# Patient Record
Sex: Male | Born: 1945 | Hispanic: Yes | Smoking: Never smoker
Health system: Southern US, Community
[De-identification: ages and names within clinical notes are randomized; demographics above are authoritative.]

## PROBLEM LIST (undated history)

## (undated) DIAGNOSIS — K146 Glossodynia: Secondary | ICD-10-CM

## (undated) DIAGNOSIS — K219 Gastro-esophageal reflux disease without esophagitis: Secondary | ICD-10-CM

## (undated) DIAGNOSIS — I10 Essential (primary) hypertension: Secondary | ICD-10-CM

## (undated) DIAGNOSIS — K2270 Barrett's esophagus without dysplasia: Secondary | ICD-10-CM

## (undated) DIAGNOSIS — G20A1 Parkinson's disease without dyskinesia, without mention of fluctuations: Secondary | ICD-10-CM

## (undated) DIAGNOSIS — I2510 Atherosclerotic heart disease of native coronary artery without angina pectoris: Secondary | ICD-10-CM

## (undated) DIAGNOSIS — G20 Parkinson's disease: Secondary | ICD-10-CM

## (undated) HISTORY — PX: CARDIAC CATHETERIZATION: SHX172

---

## 2017-12-12 ENCOUNTER — Other Ambulatory Visit: Payer: Self-pay | Admitting: Otolaryngology

## 2017-12-12 DIAGNOSIS — R1312 Dysphagia, oropharyngeal phase: Secondary | ICD-10-CM

## 2017-12-12 DIAGNOSIS — K219 Gastro-esophageal reflux disease without esophagitis: Secondary | ICD-10-CM

## 2017-12-12 DIAGNOSIS — R490 Dysphonia: Secondary | ICD-10-CM

## 2017-12-14 ENCOUNTER — Ambulatory Visit
Admission: RE | Admit: 2017-12-14 | Discharge: 2017-12-14 | Disposition: A | Discharge status: Home / Self Care | Payer: Self-pay | Source: Ambulatory Visit | Attending: Otolaryngology | Admitting: Otolaryngology

## 2017-12-14 DIAGNOSIS — R1312 Dysphagia, oropharyngeal phase: Secondary | ICD-10-CM

## 2017-12-14 DIAGNOSIS — K219 Gastro-esophageal reflux disease without esophagitis: Secondary | ICD-10-CM

## 2017-12-14 DIAGNOSIS — R490 Dysphonia: Secondary | ICD-10-CM

## 2018-02-09 ENCOUNTER — Other Ambulatory Visit: Payer: Self-pay | Admitting: Otolaryngology

## 2018-02-09 DIAGNOSIS — R070 Pain in throat: Secondary | ICD-10-CM

## 2018-02-21 ENCOUNTER — Ambulatory Visit
Admission: RE | Admit: 2018-02-21 | Discharge: 2018-02-21 | Disposition: A | Discharge status: Home / Self Care | Payer: Non-veteran care | Source: Ambulatory Visit | Attending: Otolaryngology | Admitting: Otolaryngology

## 2018-02-21 DIAGNOSIS — R070 Pain in throat: Secondary | ICD-10-CM

## 2018-02-21 MED ORDER — IOPAMIDOL (ISOVUE-300) INJECTION 61%
75.0000 mL | Freq: Once | INTRAVENOUS | Status: AC | PRN
  Administered 2018-02-21: 75 mL via INTRAVENOUS

## 2019-04-24 ENCOUNTER — Emergency Department (HOSPITAL_COMMUNITY)
Admission: EM | Admit: 2019-04-24 | Discharge: 2019-04-24 | Disposition: A | Discharge status: Home / Self Care | Payer: No Typology Code available for payment source | Attending: Emergency Medicine | Admitting: Emergency Medicine

## 2019-04-24 ENCOUNTER — Emergency Department (HOSPITAL_COMMUNITY): Payer: No Typology Code available for payment source

## 2019-04-24 ENCOUNTER — Other Ambulatory Visit: Payer: Self-pay

## 2019-04-24 ENCOUNTER — Encounter (HOSPITAL_COMMUNITY): Payer: Self-pay

## 2019-04-24 DIAGNOSIS — M542 Cervicalgia: Secondary | ICD-10-CM | POA: Insufficient documentation

## 2019-04-24 DIAGNOSIS — Z79899 Other long term (current) drug therapy: Secondary | ICD-10-CM | POA: Diagnosis not present

## 2019-04-24 DIAGNOSIS — R519 Headache, unspecified: Secondary | ICD-10-CM | POA: Diagnosis present

## 2019-04-24 DIAGNOSIS — I10 Essential (primary) hypertension: Secondary | ICD-10-CM | POA: Insufficient documentation

## 2019-04-24 DIAGNOSIS — R070 Pain in throat: Secondary | ICD-10-CM | POA: Insufficient documentation

## 2019-04-24 LAB — CBC WITH DIFFERENTIAL/PLATELET
Abs Immature Granulocytes: 0.02 10*3/uL (ref 0.00–0.07)
Basophils Absolute: 0 10*3/uL (ref 0.0–0.1)
Basophils Relative: 1 %
Eosinophils Absolute: 0 10*3/uL (ref 0.0–0.5)
Eosinophils Relative: 0 %
HCT: 46.5 % (ref 39.0–52.0)
Hemoglobin: 14.4 g/dL (ref 13.0–17.0)
Immature Granulocytes: 0 %
Lymphocytes Relative: 27 %
Lymphs Abs: 2.2 10*3/uL (ref 0.7–4.0)
MCH: 28.8 pg (ref 26.0–34.0)
MCHC: 31 g/dL (ref 30.0–36.0)
MCV: 93 fL (ref 80.0–100.0)
Monocytes Absolute: 0.6 10*3/uL (ref 0.1–1.0)
Monocytes Relative: 7 %
Neutro Abs: 5.3 10*3/uL (ref 1.7–7.7)
Neutrophils Relative %: 65 %
Platelets: 225 10*3/uL (ref 150–400)
RBC: 5 MIL/uL (ref 4.22–5.81)
RDW: 13.3 % (ref 11.5–15.5)
WBC: 8.1 10*3/uL (ref 4.0–10.5)
nRBC: 0 % (ref 0.0–0.2)

## 2019-04-24 LAB — BASIC METABOLIC PANEL
Anion gap: 11 (ref 5–15)
BUN: 21 mg/dL (ref 8–23)
CO2: 24 mmol/L (ref 22–32)
Calcium: 9.2 mg/dL (ref 8.9–10.3)
Chloride: 104 mmol/L (ref 98–111)
Creatinine, Ser: 1.42 mg/dL — ABNORMAL HIGH (ref 0.61–1.24)
GFR calc Af Amer: 56 mL/min — ABNORMAL LOW (ref >60)
GFR calc non Af Amer: 48 mL/min — ABNORMAL LOW (ref >60)
Glucose, Bld: 96 mg/dL (ref 70–99)
Potassium: 4.1 mmol/L (ref 3.5–5.1)
Sodium: 139 mmol/L (ref 135–145)

## 2019-04-24 MED ORDER — AMLODIPINE BESYLATE 5 MG PO TABS
5.0000 mg | ORAL_TABLET | Freq: Once | ORAL | Status: AC
  Administered 2019-04-24: 5 mg via ORAL
  Filled 2019-04-24: qty 1

## 2019-04-24 MED ORDER — BUTALBITAL-APAP-CAFFEINE 50-325-40 MG PO TABS: 1.0000 | ORAL_TABLET | Freq: Four times a day (QID) | ORAL | 0 refills | Status: AC | PRN

## 2019-04-24 MED ORDER — AMLODIPINE BESYLATE 5 MG PO TABS: 5.0000 mg | ORAL_TABLET | Freq: Every day | ORAL | 0 refills | Status: DC

## 2019-04-24 NOTE — ED Provider Notes (Signed)
[facility]-EMERGENCY DEPT Provider Note   CSN: [id number] Arrival date & time: 04/24/19  0556     History   Chief Complaint Chief Complaint  Patient presents with   Hypertension   Headache    HPI Zachary Heath is a 73 y.o. male.     HPI   Monday night began to have headache Right sided headache, behind right eye and right neck Started in neck then radiated up to head, started and slowly worsened Tried ibuprofen which helped temporarily helped until nighttime then started again Last night checked BP was 194/90, is on amlodipine-benazepril, normally BP will run lower even when he doesn't take blood pressure medicine, has missed some doses, not necessarily more recently No falls or trauma, no fevers, no numbness/weakness (Right side weaker when checked for parkinson's at baseline, big toe has been numb no chronically)-nothing new with the headache Nausea, no vomiting. No exertional headaches.   History reviewed. No pertinent past medical history.  There are no active problems to display for this patient.   History reviewed. No pertinent surgical history.      Home Medications    Prior to Admission medications   Medication Sig Start Date End Date Taking? Authorizing Provider  amLODipine-benazepril (LOTREL) 10-40 MG capsule Take 1 capsule by mouth daily. 01/07/19  Yes [provider]  carbidopa-levodopa (SINEMET IR) 25-100 MG tablet Take 1 tablet by mouth 4 (four) times daily.  11/19/18  Yes [provider]  ibuprofen (ADVIL) 200 MG tablet Take 600 mg by mouth every 6 (six) hours as needed for fever, headache or moderate pain.   Yes [provider]  Multiple Vitamin (MULTIVITAMIN WITH MINERALS) TABS tablet Take 1 tablet by mouth daily.   Yes [provider]  OVER THE COUNTER MEDICATION Take 1 tablet by mouth daily. Clear nails plus vitamin   Yes [provider]  pantoprazole (PROTONIX) 40 MG tablet  Take 40 mg by mouth daily. 03/25/19  Yes [provider]  rasagiline (AZILECT) 1 MG TABS tablet Take 1 mg by mouth daily. 04/09/19  Yes [provider]  amLODipine (NORVASC) 5 MG tablet Take 1 tablet (5 mg total) by mouth daily. 04/24/19   Alvira Monday, MD  butalbital-acetaminophen-caffeine (FIORICET) 6024969804 MG tablet Take 1-2 tablets by mouth every 6 (six) hours as needed for headache. 04/24/19 04/23/20  Alvira Monday, MD    Family History History reviewed. No pertinent family history.  Social History Social History   Tobacco Use   Smoking status: Never Smoker   Smokeless tobacco: Never Used  Substance Use Topics   Alcohol use: Never    Frequency: Never   Drug use: Never     Allergies   Penicillin g   Review of Systems Review of Systems  Constitutional: Negative for fever.  HENT: Positive for sore throat (chronic not new).   Eyes: Negative for visual disturbance.  Respiratory: Negative for cough (occasional, not new) and shortness of breath.   Cardiovascular: Negative for chest pain.  Gastrointestinal: Positive for nausea. Negative for abdominal pain.  Genitourinary: Negative for dysuria.  Musculoskeletal: Positive for neck pain. Negative for back pain. Gait problem: with parkinson's.  Neurological: Positive for headaches. Negative for syncope, facial asymmetry, speech difficulty, weakness and numbness.     Physical Exam Updated Vital Signs BP 140/73    Pulse 82    Temp 98.7 F (37.1 C) (Oral)    Resp 16    SpO2 99%   Physical Exam Vitals signs  and nursing note reviewed.  Constitutional:      General: He is not in acute distress.    Appearance: He is well-developed. He is not diaphoretic.  HENT:     Head: Normocephalic and atraumatic.  Eyes:     General: No visual field deficit.    Conjunctiva/sclera: Conjunctivae normal.  Neck:     Musculoskeletal: Normal range of motion.  Cardiovascular:     Rate and Rhythm: Normal rate and  regular rhythm.     Heart sounds: Normal heart sounds. No murmur. No friction rub. No gallop.   Pulmonary:     Effort: Pulmonary effort is normal. No respiratory distress.     Breath sounds: Normal breath sounds. No wheezing or rales.  Abdominal:     General: There is no distension.     Palpations: Abdomen is soft.     Tenderness: There is no abdominal tenderness. There is no guarding.  Skin:    General: Skin is warm and dry.  Neurological:     Mental Status: He is alert and oriented to person, place, and time.     GCS: GCS eye subscore is 4. GCS verbal subscore is 5. GCS motor subscore is 6.     Cranial Nerves: Cranial nerves are intact. No cranial nerve deficit, dysarthria or facial asymmetry.     Sensory: Sensation is intact. No sensory deficit.     Motor: Motor function is intact. No weakness or pronator drift.     Coordination: Coordination is intact. Coordination normal. Heel to Shin Test normal.     Gait: Gait is intact.      ED Treatments / Results  Labs (all labs ordered are listed, but only abnormal results are displayed) Labs Reviewed  BASIC METABOLIC PANEL - Abnormal; Notable for the following components:      Result Value   Creatinine, Ser 1.42 (*)    GFR calc non Af Amer 48 (*)    GFR calc Af Amer 56 (*)    All other components within normal limits  CBC WITH DIFFERENTIAL/PLATELET    EKG None  Radiology Ct Head Wo Contrast  Result Date: 04/24/2019 CLINICAL DATA:  73 year old male with acute headache for 3 days. Initial encounter. EXAM: CT HEAD WITHOUT CONTRAST TECHNIQUE: Contiguous axial images were obtained from the base of the skull through the vertex without intravenous contrast. COMPARISON:  None. FINDINGS: Brain: No evidence of acute infarction, hemorrhage, hydrocephalus, extra-axial collection or mass lesion/mass effect. Mild atrophy and chronic small-vessel white matter ischemic changes noted. Vascular: Carotid atherosclerotic calcifications noted. Skull:  Normal. Negative for fracture or focal lesion. Sinuses/Orbits: No acute finding. Other: None. IMPRESSION: 1. No evidence of acute intracranial abnormality. 2. Mild atrophy and chronic small-vessel white matter ischemic changes. Electronically Signed   By: Harmon PierJeffrey  Hu M.D.   On: 04/24/2019 10:17    Procedures Procedures (including critical care time)  Medications Ordered in ED Medications  amLODipine (NORVASC) tablet 5 mg (5 mg Oral Given 04/24/19 0809)     Initial Impression / Assessment and Plan / ED Course  I have reviewed the triage vital signs and the nursing notes.  Pertinent labs & imaging results that were available during my care of the patient were reviewed by me and considered in my medical decision making (see chart for details).        73yo male with history of hypertension, CKD presents with concern for headache.  CT head WNL. No neurologic symptoms, or signs of CVA by history or  exam. NO fever, no signs of meningitis.  Headache began slowly, doubt occult SAH.  BP elevated. Denies other symptoms to suggest htn emergency, no CP/dyspnea.  Renal function at baseline on check with [facility] baseline 1.4.   Does report some noncompliance with medications. Recommend taking BP medication as prescribed, gave rx for fioricet for headache prn. Rec PCP follow up for htn and CKD. Patient discharged in stable condition with understanding of reasons to return.   Final Clinical Impressions(s) / ED Diagnoses   Final diagnoses:  Acute nonintractable headache, unspecified headache type    ED Discharge Orders         Ordered    butalbital-acetaminophen-caffeine (FIORICET) 50-325-40 MG tablet  Every 6 hours PRN     04/24/19 1043    amLODipine (NORVASC) 5 MG tablet  Daily     04/24/19 1043           Gareth Morgan, MD 04/24/19 2108

## 2019-04-24 NOTE — ED Notes (Signed)
Patient transported to CT 

## 2019-04-24 NOTE — ED Triage Notes (Signed)
Pt complains of an intermittent headache since Monday, he states the headache went away with ibuprofen but this am the headache woke him up and he checked his blood pressure and it was high Pt does take BP medication

## 2019-04-24 NOTE — ED Notes (Signed)
ED Provider at bedside. 

## 2020-06-08 ENCOUNTER — Other Ambulatory Visit (HOSPITAL_BASED_OUTPATIENT_CLINIC_OR_DEPARTMENT_OTHER): Payer: Self-pay

## 2020-06-08 DIAGNOSIS — R0683 Snoring: Secondary | ICD-10-CM

## 2020-07-03 ENCOUNTER — Encounter (HOSPITAL_BASED_OUTPATIENT_CLINIC_OR_DEPARTMENT_OTHER): Payer: Non-veteran care | Admitting: Internal Medicine

## 2021-01-26 ENCOUNTER — Encounter (HOSPITAL_COMMUNITY): Payer: Self-pay

## 2021-01-26 ENCOUNTER — Telehealth (HOSPITAL_COMMUNITY): Payer: Self-pay

## 2021-01-26 NOTE — Telephone Encounter (Signed)
Attempted to call patient in regards to Cardiac Rehab - LM on VM Mailed letter 

## 2021-01-26 NOTE — Telephone Encounter (Signed)
Referral was recv'ed from Dr. Nevada Crane.

## 2021-01-26 NOTE — Telephone Encounter (Signed)
Outside/paper referral received by Dr. Melchor Amour from [facility].

## 2021-01-27 ENCOUNTER — Telehealth (HOSPITAL_COMMUNITY): Payer: Self-pay

## 2021-01-27 NOTE — Telephone Encounter (Signed)
Received an office referral for pt to participate in the cardiac rehab program, called pt to see if he was interested. Pt stated that he is not interested at this time and that he would give [place] a call if anything changes.

## 2021-02-17 NOTE — Progress Notes (Signed)
 [facility] Note 02/17/2021  Patient:  Zachary Heath  [id number]  MRN: [id number] DOB: [birth year] Age: 75 y.o.  Encounter Date:  02/17/2021  Reason for Visit: Post Operative Follow Up   Subjective:  HPI:  Zachary Heath presents today for an initial post op follow up after undergoing a 3-vessel CABG (fRIMA-D1-OM1, LIMA-LAD) on 01/14/21 via full median sternotomy. He was discharged to [facility] on 01/21/21 after an uncomplicated hospital course. He was discharged on from [facility] on 02/01/21  Today, he voiced good progress from [facility]. He voices continued chest wall pain but relieved by acetaminophen . He also voiced difficulty with ambulation but attributes it to his Parkinson's disease. Denies incisional redness, drainage, or opening. Appetite and eating have improved. Sleep has improved. Ambulating well without assitance. Denies recent fever, chills, HA,CP, palpitations, SOB, abd discomfort, leg swelling, weakness.   Past Medical History:  Diagnosis Date   Coronary artery disease    GERD (gastroesophageal reflux disease)    Hypertension    Hypertension with goal to be determined    Parkinson's disease (HCC)     Past Surgical History:  Procedure Laterality Date   CORONARY ARTERY BYPASS GRAFT N/A 01/14/2021   Procedure: CABG ON PUMP - CORONARY ARTERY BYPASS GRAFT;  Surgeon: [facility] JONETTA Dines, MD;  Location: [facility] MAIN OR;  Service: Cardiothoracic;  Laterality: N/A;   NISSEN FUNDOPLICATION     NISSEN FUNDOPLICATION N/A     Family History  Problem Relation Age of Onset   Heart disease Brother        Unknown type   Stroke Brother     Social History   Socioeconomic History   Marital status: Separated Not Legal    Spouse name: Not on file   Number of children: Not on file   Years of education: Not on file   Highest education level: Not on file  Occupational History   Not on file  Tobacco Use    Smoking status: Never Smoker   Smokeless tobacco: Never Used  Vaping Use   Vaping Use: Never used  Substance and Sexual Activity   Alcohol use: No   Drug use: No   Sexual activity: Not on file  Other Topics Concern   Not on file  Social History Narrative   Denies alcohol or tobacco use. Denies illicit drugs. States his diet is mostly rice and chicken and could use more vegetables.   Social Determinants of Health   Financial Resource Strain: Not on file  Food Insecurity: Not on file  Transportation Needs: Not on file  Physical Activity: Not on file  Stress: Not on file  Social Connections: Not on file  Housing Stability: Not on file    Allergies  Allergen Reactions   Penicillin Anaphylaxis (ALLERGY)   Benazepril Cough (ALLERGY/intolerance)    Pt states he is not aware of this allergy    Outpatient Medications Prior to Visit  Medication Sig Dispense Refill   acetaminophen  (TYLENOL ) 500 MG tablet Take 2 tablets (1,000 mg total) by mouth every 8 (eight) hours as needed.     aspirin  81 MG EC tablet *ANTIPLATELET* Take 2 tablets (162 mg total) by mouth daily. 60 tablet 0   carbidopa -levodopa  (SINEMET  CR) 50-200 mg per tablet Take 1 tablet by mouth nightly.     carbidopa -levodopa  (SINEMET ) 25-100 mg per tablet Take 1 tablet by mouth 4 times daily.     cetirizine (ZYRTEC) 10 MG tablet Take 1  tablet (10 mg total) by mouth daily. 30 tablet 0   cholecalciferol, vitamin D3, 50 mcg (2,000 unit) Cap Take 1 capsule by mouth daily.     ferrous sulfate (FERATAB) 325 (65 FE) MG tablet Take 325 mg by mouth daily.     furosemide  (LASIX ) 40 MG tablet Take 1 tablet (40 mg total) by mouth daily. 30 tablet 0   guaiFENesin  (ROBITUSSIN) 100 mg/5 mL syrup Take 10 mLs (200 mg total) by mouth every 4 (four) hours as needed for Cough. 120 mL 0   metoprolol  tartrate (LOPRESSOR ) 37.5 mg tablet Take 1 tablet (37.5 mg total) by mouth 2 times daily. 60 tablet 0   multivit  w/iron-minerals (THERA-M, THERA M PLUS) 9 mg iron-400 mcg Tab tablet Take 1 tablet by mouth daily. 30 tablet 0   pantoprazole  (PROTONIX ) 40 MG tablet Take 40 mg by mouth in the morning and at bedtime.     polyethylene glycol (MIRALAX ) 17 gram packet Take 17 g by mouth daily as needed. 15 packet 0   rasagiline  (AZILECT ) 1 mg tablet Take 0.5 mg by mouth with lunch.     rOPINIRole  (REQUIP ) 1 MG tablet Take 1 mg by mouth 4 times daily.     rosuvastatin (CRESTOR) 20 MG tablet Take 1 tablet (20 mg total) by mouth daily. 30 tablet 0   senna-docusate (PERICOLACE, SENOKOT-S) 8.6-50 mg per tablet Take 2 tablets by mouth 2 times daily.  0   triamcinolone (NASACORT) 55 mcg nasal inhaler 2 sprays by Nasal route daily.     zinc gluconate 50 mg tablet Take 50 mg by mouth daily.     No facility-administered medications prior to visit.      Objective:  Physical Examination:  Blood pressure 150/66, pulse 78, temperature 97.2 F (36.2 C), temperature source Temporal, resp. rate 16, height 1.702 m (5' 7), weight 73.5 kg (162 lb), SpO2 100 %. General appearance - alert, well appearing, and in no distress Mental status - normal mood, behavior, speech, dress, motor activity, and thought processes Eyes - sclera anicteric Ears - hearing grossly normal bilaterally Chest - clear to auscultation, no wheezes, rales or rhonchi, symmetric air entry Heart - normal rate, regular rhythm, normal S1, S2, no murmurs, rubs, clicks or gallops Abdomen - soft, nontender, nondistended, no masses or organomegaly Extremities - no pedal edema noted Skin - full median sternotomy, blake drains sites well-approximated without erythema or drainage.   Assessment: CAD S/P 3-vessel CABG (fRIMA-D1-OM1, LIMA-LAD) on 01/14/21 - progress from [facility] is as expected   Plan: 1. Continue all current medications. 2. Eat low fat / low salt diet. Increase walking / exercise. 3. CT [facility] follow up as needed.  4. Continue to see PCP  / cardiologist for future cardiac surveillance.   Pt voiced understanding and agreed with plan.   Future Appointments      Provider Department Dept Phone Center   04/08/2021 10:15 AM Richardson Ee [facility] - [place] [phone] [facility]        Electronically signed by: Grover Lunger Dimashuhid Vedar, PA-C 02/17/2021     Electronically signed by: Grover Lunger Dimasuhid Vedar, PA-C 02/17/21 1142

## 2021-02-24 ENCOUNTER — Emergency Department (HOSPITAL_COMMUNITY): Payer: No Typology Code available for payment source

## 2021-02-24 ENCOUNTER — Encounter (HOSPITAL_COMMUNITY): Payer: Self-pay | Admitting: Emergency Medicine

## 2021-02-24 ENCOUNTER — Other Ambulatory Visit: Payer: Self-pay

## 2021-02-24 ENCOUNTER — Emergency Department (HOSPITAL_COMMUNITY)
Admission: EM | Admit: 2021-02-24 | Discharge: 2021-02-24 | Disposition: A | Discharge status: Home / Self Care | Payer: No Typology Code available for payment source | Attending: Emergency Medicine | Admitting: Emergency Medicine

## 2021-02-24 DIAGNOSIS — R0602 Shortness of breath: Secondary | ICD-10-CM | POA: Diagnosis not present

## 2021-02-24 DIAGNOSIS — Z79899 Other long term (current) drug therapy: Secondary | ICD-10-CM | POA: Insufficient documentation

## 2021-02-24 DIAGNOSIS — Z951 Presence of aortocoronary bypass graft: Secondary | ICD-10-CM | POA: Insufficient documentation

## 2021-02-24 DIAGNOSIS — I2510 Atherosclerotic heart disease of native coronary artery without angina pectoris: Secondary | ICD-10-CM | POA: Diagnosis not present

## 2021-02-24 DIAGNOSIS — I10 Essential (primary) hypertension: Secondary | ICD-10-CM | POA: Diagnosis not present

## 2021-02-24 DIAGNOSIS — R030 Elevated blood-pressure reading, without diagnosis of hypertension: Secondary | ICD-10-CM | POA: Diagnosis present

## 2021-02-24 HISTORY — DX: Essential (primary) hypertension: I10

## 2021-02-24 LAB — CBC
HCT: 39.4 % (ref 39.0–52.0)
Hemoglobin: 12.2 g/dL — ABNORMAL LOW (ref 13.0–17.0)
MCH: 29.4 pg (ref 26.0–34.0)
MCHC: 31 g/dL (ref 30.0–36.0)
MCV: 94.9 fL (ref 80.0–100.0)
Platelets: 226 10*3/uL (ref 150–400)
RBC: 4.15 MIL/uL — ABNORMAL LOW (ref 4.22–5.81)
RDW: 14.4 % (ref 11.5–15.5)
WBC: 8.1 10*3/uL (ref 4.0–10.5)
nRBC: 0 % (ref 0.0–0.2)

## 2021-02-24 LAB — BRAIN NATRIURETIC PEPTIDE: B Natriuretic Peptide: 241.9 pg/mL — ABNORMAL HIGH (ref 0.0–100.0)

## 2021-02-24 LAB — BASIC METABOLIC PANEL
Anion gap: 8 (ref 5–15)
BUN: 27 mg/dL — ABNORMAL HIGH (ref 8–23)
CO2: 28 mmol/L (ref 22–32)
Calcium: 9.3 mg/dL (ref 8.9–10.3)
Chloride: 106 mmol/L (ref 98–111)
Creatinine, Ser: 1.59 mg/dL — ABNORMAL HIGH (ref 0.61–1.24)
GFR, Estimated: 45 mL/min — ABNORMAL LOW (ref >60)
Glucose, Bld: 96 mg/dL (ref 70–99)
Potassium: 4.5 mmol/L (ref 3.5–5.1)
Sodium: 142 mmol/L (ref 135–145)

## 2021-02-24 LAB — TROPONIN I (HIGH SENSITIVITY): Troponin I (High Sensitivity): 19 ng/L — ABNORMAL HIGH (ref <18)

## 2021-02-24 MED ORDER — LOSARTAN POTASSIUM 25 MG PO TABS: ORAL_TABLET | ORAL | 0 refills | Status: DC

## 2021-02-24 NOTE — ED Notes (Signed)
Pt was able to ambulate without any assistance. Pt denied any weakness, shortness of breath with ambulation. Pt overall stated he "felt normal" with ambulation. Pt's SpO2 maintained 100% RA with ambulation with good waveform.

## 2021-02-24 NOTE — Discharge Instructions (Addendum)
Start out with losartan 25mg  daily If Blood pressure does not improve, you can start 50mg  after 5 days Please see your cardiologist in one week

## 2021-02-24 NOTE — ED Triage Notes (Signed)
Pt states he has shortness of breath that began 10 minutes ago. Pt had triple bypass surgery on 01/14/21. Pt denies pain.

## 2021-02-24 NOTE — ED Provider Notes (Signed)
[facility]-EMERGENCY DEPT Provider Note   CSN: [id number] Arrival date & time: 02/24/21  0216     History Chief Complaint  Patient presents with   Shortness of Breath    Zachary Heath is a 75 y.o. male.  The history is provided by the patient and a relative.  Shortness of Breath Severity:  Moderate Onset quality:  Sudden Timing:  Constant Progression:  Improving Chronicity:  New Relieved by:  None tried Associated symptoms: no abdominal pain, no chest pain and no fever      Patient with history of hypertension and CAD presents with shortness of breath.  Patient reports he was watching baseball tonight when he had sudden onset of shortness of breath.  No chest pain.  No fevers, no new cough. He is now feeling improved.  He reports he had CABG performed on July 1.  He has done well since the surgery, and denies any dyspnea on exertion.  Reports no new significant lower extremity edema.  He reports medication compliance but does admit that they stopped his losartan recently due to low blood pressures He is a non-smoker. Past Medical History:  Diagnosis Date   Hypertension      Social History   Tobacco Use   Smoking status: Never   Smokeless tobacco: Never  Substance Use Topics   Alcohol use: Never   Drug use: Never    Home Medications Prior to Admission medications   Medication Sig Start Date End Date Taking? Authorizing Provider  amLODipine-benazepril (LOTREL) 10-40 MG capsule Take 1 capsule by mouth daily. 01/07/19   [provider]  carbidopa-levodopa (SINEMET IR) 25-100 MG tablet Take 1 tablet by mouth 4 (four) times daily.  11/19/18   [provider]  ibuprofen (ADVIL) 200 MG tablet Take 600 mg by mouth every 6 (six) hours as needed for fever, headache or moderate pain.    [provider]  Multiple Vitamin (MULTIVITAMIN WITH MINERALS) TABS tablet Take 1 tablet by mouth daily.    [provider]  OVER  THE COUNTER MEDICATION Take 1 tablet by mouth daily. Clear nails plus vitamin    [provider]  pantoprazole (PROTONIX) 40 MG tablet Take 40 mg by mouth daily. 03/25/19   [provider]  rasagiline (AZILECT) 1 MG TABS tablet Take 1 mg by mouth daily. 04/09/19   [provider]  amLODipine (NORVASC) 5 MG tablet Take 1 tablet (5 mg total) by mouth daily. 04/24/19 04/24/19  Alvira Monday, MD    Allergies    Penicillin g  Review of Systems   Review of Systems  Constitutional:  Negative for fever.  Respiratory:  Positive for shortness of breath.   Cardiovascular:  Negative for chest pain.  Gastrointestinal:  Negative for abdominal pain.  Musculoskeletal:  Negative for back pain.  All other systems reviewed and are negative.  Physical Exam Updated Vital Signs BP (!) 163/82   Pulse 74   Temp 97.9 F (36.6 C) (Oral)   Resp 20   Ht 1.702 m (5\' 7" )   Wt 75.8 kg   SpO2 99%   BMI 26.16 kg/m   Physical Exam CONSTITUTIONAL: Elderly, no acute distress HEAD: Normocephalic/atraumatic EYES: EOMI/PERRL ENMT: Mucous membranes moist NECK: supple no meningeal signs SPINE/BACK:entire spine nontender CV: S1/S2 noted, no murmurs/rubs/gallops noted LUNGS: Scattered wheeze, no acute distress.  No crackles ABDOMEN: soft, nontender, no rebound or guarding, bowel sounds noted throughout abdomen GU:no cva tenderness NEURO: Pt is awake/alert/appropriate, moves all extremitiesx4.  No facial droop.   EXTREMITIES: pulses normal/equal, full ROM, minimal pitting edema to bilateral lower extremities SKIN: warm, color normal PSYCH: no abnormalities of mood noted, alert and oriented to situation  ED Results / Procedures / Treatments   Labs (all labs ordered are listed, but only abnormal results are displayed) Labs Reviewed  CBC - Abnormal; Notable for the following components:      Result Value   RBC 4.15 (*)    Hemoglobin 12.2 (*)    All other components within normal  limits  TROPONIN I (HIGH SENSITIVITY) - Abnormal; Notable for the following components:   Troponin I (High Sensitivity) 19 (*)    All other components within normal limits  BASIC METABOLIC PANEL  BRAIN NATRIURETIC PEPTIDE    EKG EKG Interpretation  Date/Time:  Thursday February 24 2021 02:25:50 EDT Ventricular Rate:  82 PR Interval:  172 QRS Duration: 103 QT Interval:  370 QTC Calculation: 433 R Axis:   -3 Text Interpretation: Sinus rhythm Atrial premature complex Probable left atrial enlargement LVH with secondary repolarization abnormality 12 Lead; Mason-Likar Interpretation limited secondary to artifact Confirmed by Zadie Rhine ([id number]) on 02/24/2021 2:38:09 AM  Radiology DG Chest 2 View  Result Date: 02/24/2021 CLINICAL DATA:  Sudden onset shortness of breath, history of heart surgery EXAM: CHEST - 2 VIEW COMPARISON:  Esophagram 12/14/2017 FINDINGS: Blunting of right costophrenic sulcus may reflect a small effusion. Mild pulmonary vascular congestion without features of frank edema. Postsurgical changes from prior sternotomy and CABG. The aorta is calcified. The remaining cardiomediastinal contours are unremarkable. Telemetry leads overlie the chest. No acute osseous or soft tissue abnormality. IMPRESSION: Blunting of the right costophrenic sulcus could reflect small effusion or scarring. Mild pulmonary vascular congestion without evidence of frank edema. Prior sternotomy, CABG. Aortic Atherosclerosis (ICD10-I70.0). Electronically Signed   By: Kreg Shropshire M.D.   On: 02/24/2021 03:35    Procedures Procedures   Medications Ordered in ED Medications - No data to display  ED Course  I have reviewed the triage vital signs and the nursing notes.  Pertinent labs & imaging results that were available during my care of the patient were reviewed by me and considered in my medical decision making (see chart for details).    MDM Rules/Calculators/A&P                           4:36  AM Patient presents with shortness of breath that has since resolved.  Patient ambulated reports department with any hypoxia.  He is in no acute distress and appears well.  He flatly denies any chest pain or back pain Patient underwent coronary bypass surgery on July 1 at [facility] It appears that he has mildly chronically elevated troponins. Will check BNP and reassess. 5:51 AM Patient is in no acute distress.  He ambulated without difficulty Patient feels at baseline.  He appears to be doing very well post CABG. I suspect his elevations of blood pressure may contribute to his symptoms.  He would like to restart losartan.  He will be placed on 25 mg daily, and if no improvement he can go to his normal dose of 50 mg daily He needs to see his cardiologist in the next week. Low suspicion for acute CHF/PE/pneumonia at this time. Patient is requesting discharge Final Clinical Impression(s) / ED Diagnoses Final diagnoses:  Primary hypertension  Shortness of breath    Rx / DC Orders ED Discharge Orders  Ordered    losartan (COZAAR) 25 MG tablet        02/24/21 0534             Zadie Rhine, MD 02/24/21 [phone]

## 2021-02-24 NOTE — ED Notes (Signed)
Per [facility]@ main lab recollect needed for light green tube. [employer]

## 2021-05-24 IMAGING — CT CT HEAD W/O CM
3 series · 16 of 47 positions shown, 19 images · non-contrast
Comparison: None.

CLINICAL DATA: 73-year-old male with acute headache for 3 days.
Initial encounter.

EXAM:
CT HEAD WITHOUT CONTRAST
TECHNIQUE: Contiguous axial images were obtained from the base of the skull
through the vertex without intravenous contrast.

[Series 2: head wo · axial · 0.47mm/px · z∈[-93,+37]mm · 10 of 32 slices shown, 13 images]
[im 3/32  brain]
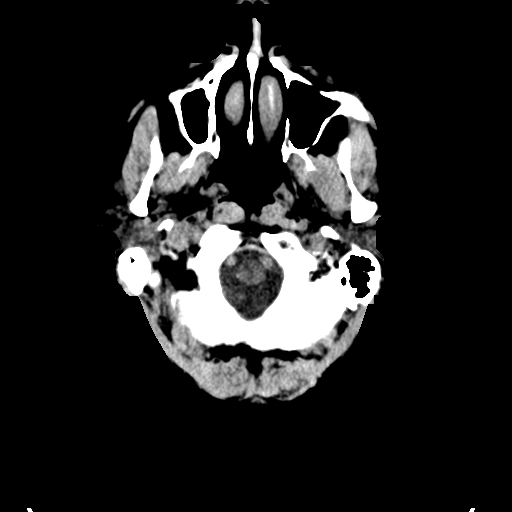
[im 3/32  bone]
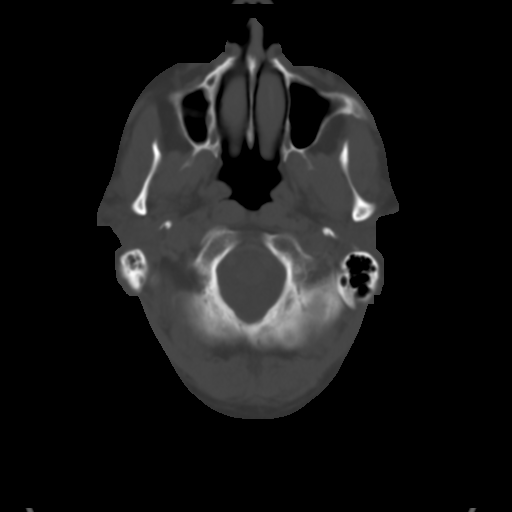
[im 6/32  brain]
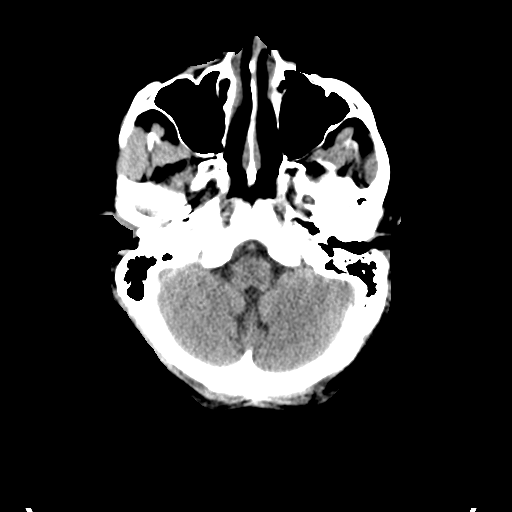
[im 9/32  brain]
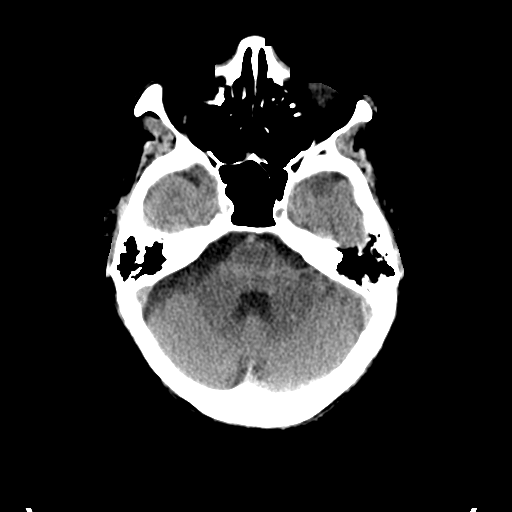
[im 11/32  brain]
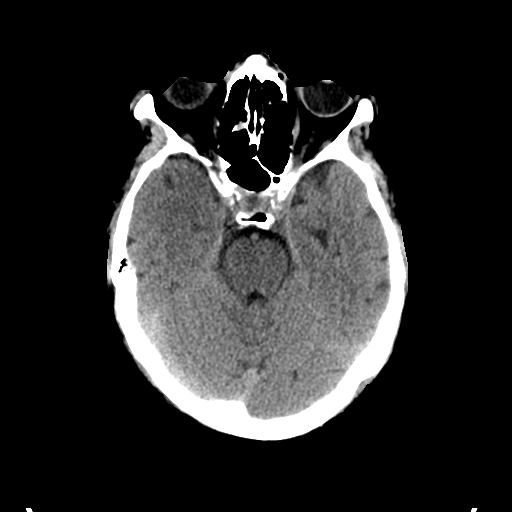
[im 14/32  brain]
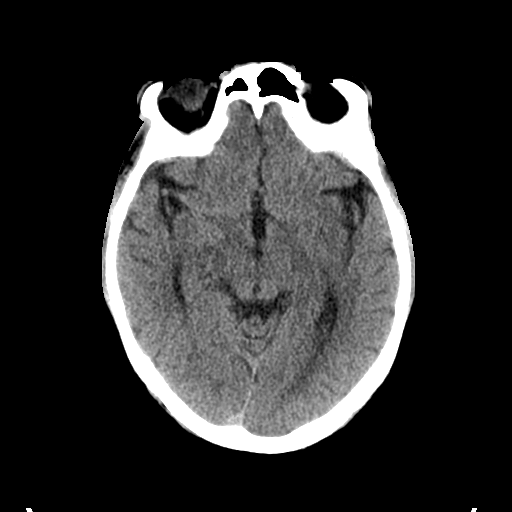
[im 14/32  bone]
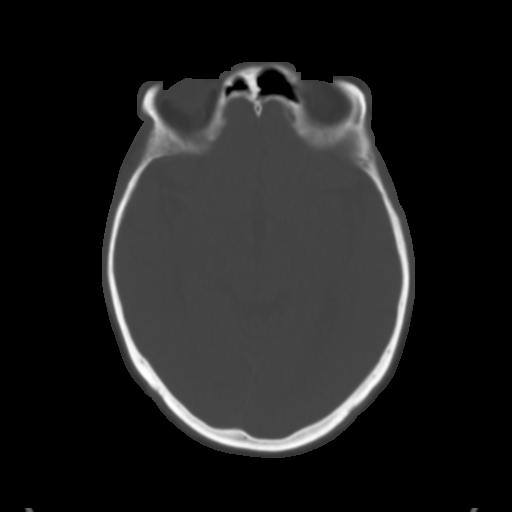
[im 18/32  brain]
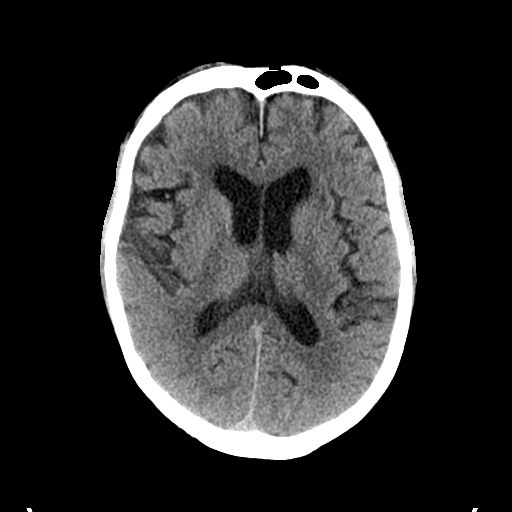
[im 21/32  brain]
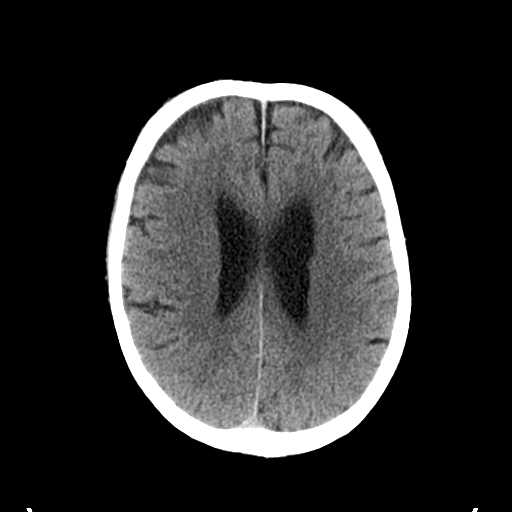
[im 24/32  brain]
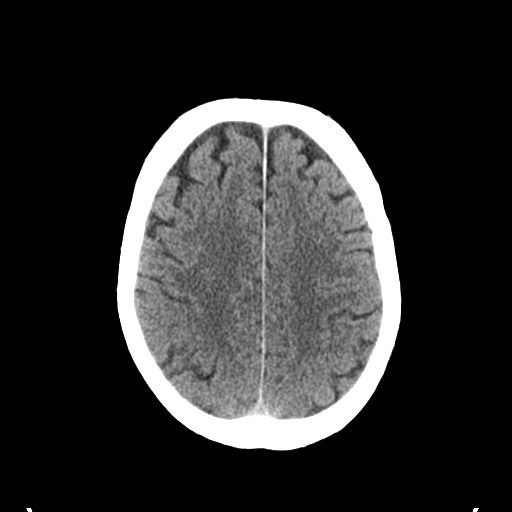
[im 26/32  brain]
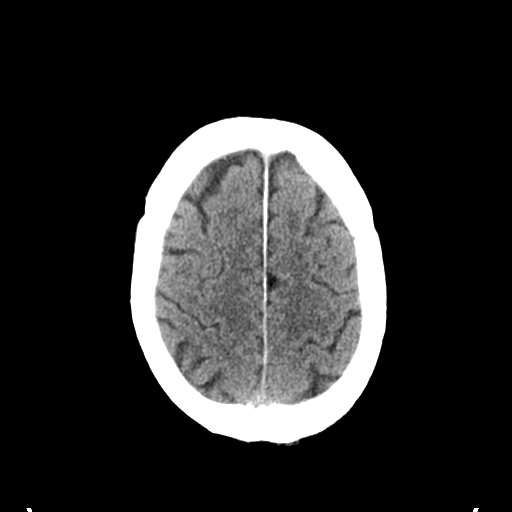
[im 26/32  bone]
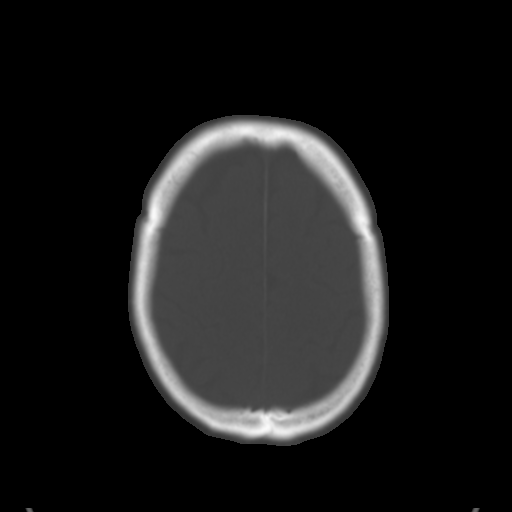
[im 29/32  brain]
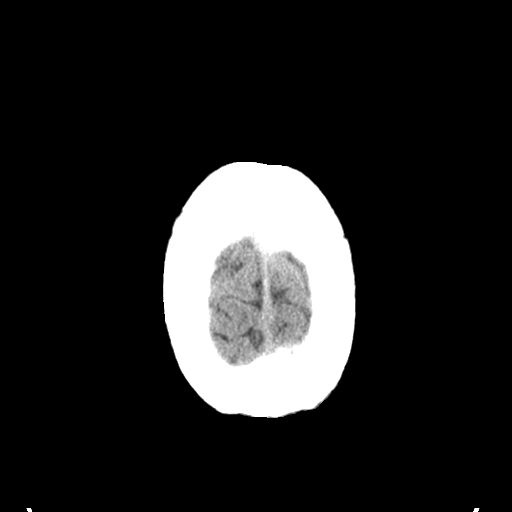

[Series 5: coronal soft tissue · coronal · 0.33mm/px · 3 of 73 slices shown]
[im 25/73  brain]
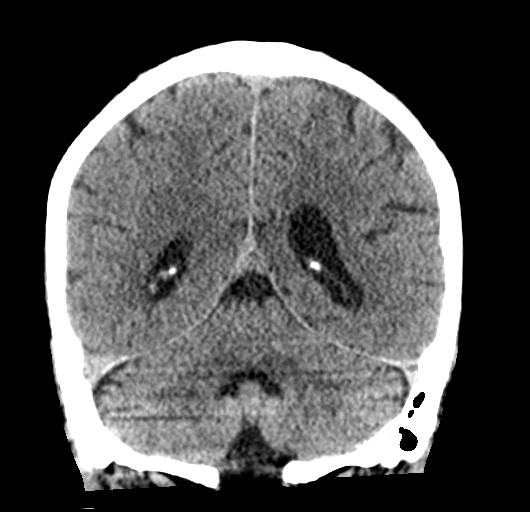
[im 33/73  brain]
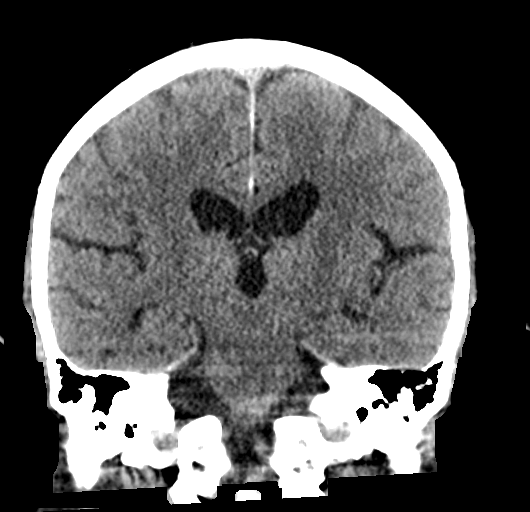
[im 41/73  brain]
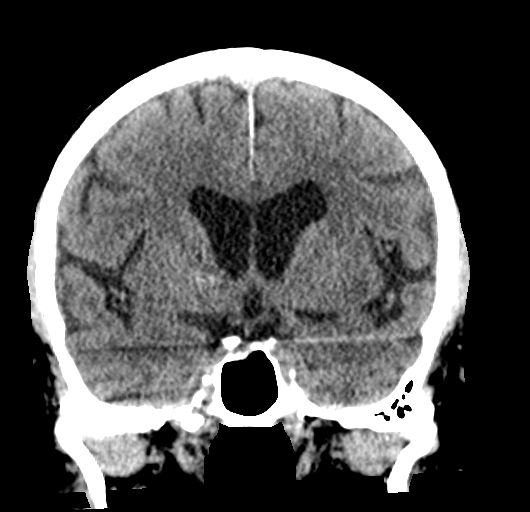

[Series 6: sagittal soft tissue · sagittal · 0.33mm/px · 3 of 59 slices shown]
[im 20/59  brain]
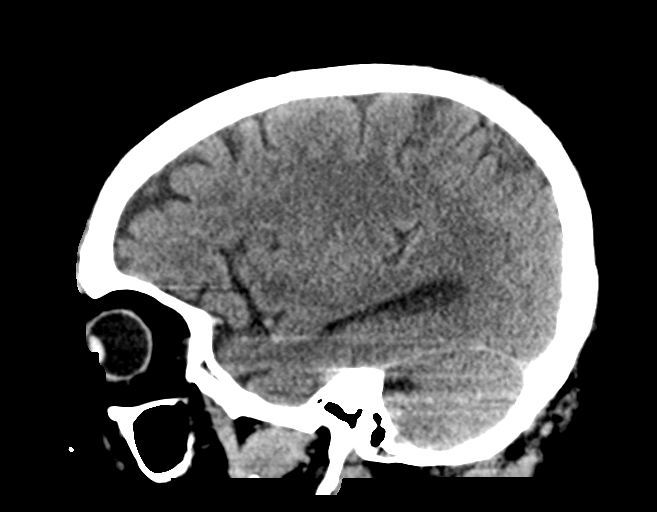
[im 30/59  brain]
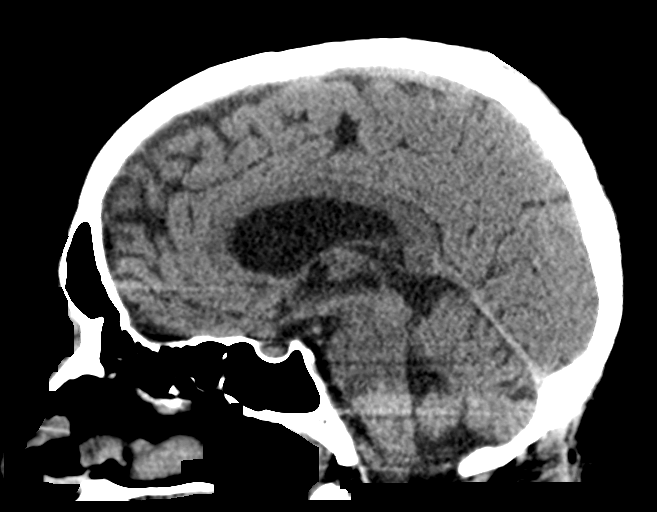
[im 39/59  brain]
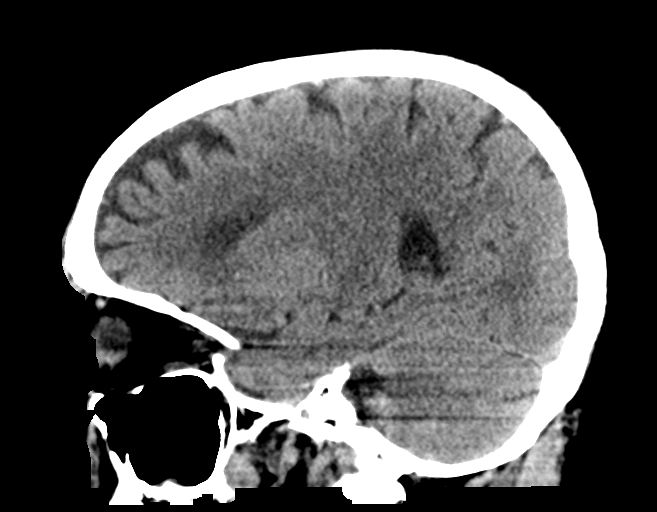

[16 of 47 positions shown; findings below may reference images not displayed]

FINDINGS: Brain: No evidence of acute infarction, hemorrhage, hydrocephalus,
extra-axial collection or mass lesion/mass effect.

Mild atrophy and chronic small-vessel white matter ischemic changes
noted.

Vascular: Carotid atherosclerotic calcifications noted.

Skull: Normal. Negative for fracture or focal lesion.

Sinuses/Orbits: No acute finding.

Other: None.
IMPRESSION: 1. No evidence of acute intracranial abnormality.
2. Mild atrophy and chronic small-vessel white matter ischemic
changes.

## 2021-07-20 ENCOUNTER — Telehealth (HOSPITAL_COMMUNITY): Payer: Self-pay | Admitting: *Deleted

## 2021-07-20 NOTE — Telephone Encounter (Signed)
Received call from pt daughter Anderson Malta.  Asked if we had received [facility] authorization for her her father to participate in CR.  Advised pt daughter that yes, I had received the authorization and are awaiting additional requested documents. Once received and reviewed will contact for scheduling. Verbalized understanding. Cherre Huger, BSN Cardiac and Training and development officer

## 2021-08-04 ENCOUNTER — Telehealth (HOSPITAL_COMMUNITY): Payer: Self-pay

## 2021-08-04 NOTE — Telephone Encounter (Signed)
Pt is covered thru the [place], [place] # [id number].

## 2021-08-04 NOTE — Telephone Encounter (Signed)
Attempted to contact Tom[place] with the [facility] in regards to ppw needed. LMTCB

## 2021-08-30 ENCOUNTER — Telehealth (HOSPITAL_COMMUNITY): Payer: Self-pay

## 2021-08-30 NOTE — Telephone Encounter (Signed)
Called patient to see if he was interested in participating in the Cardiac Rehab Program. Patient stated yes. Patient will come in for orientation on 09/27/21 @ 1030AM and will attend the 1030AM exercise class.   Pensions consultant.

## 2021-09-26 ENCOUNTER — Telehealth (HOSPITAL_COMMUNITY): Payer: Self-pay

## 2021-09-27 ENCOUNTER — Other Ambulatory Visit: Payer: Self-pay

## 2021-09-27 ENCOUNTER — Encounter (HOSPITAL_COMMUNITY): Payer: Self-pay

## 2021-09-27 ENCOUNTER — Encounter (HOSPITAL_COMMUNITY)
Admission: RE | Admit: 2021-09-27 | Discharge: 2021-09-27 | Disposition: A | Discharge status: Home / Self Care | Payer: No Typology Code available for payment source | Source: Ambulatory Visit | Attending: Cardiology | Admitting: Cardiology

## 2021-09-27 VITALS — BP 118/70 | HR 67 | Ht 65.25 in | Wt 165.1 lb

## 2021-09-27 DIAGNOSIS — Z951 Presence of aortocoronary bypass graft: Secondary | ICD-10-CM | POA: Insufficient documentation

## 2021-09-27 HISTORY — DX: Glossodynia: K14.6

## 2021-09-27 HISTORY — DX: Parkinson's disease without dyskinesia, without mention of fluctuations: G20.A1

## 2021-09-27 HISTORY — DX: Parkinson's disease: G20

## 2021-09-27 HISTORY — DX: Gastro-esophageal reflux disease without esophagitis: K21.9

## 2021-09-27 HISTORY — DX: Barrett's esophagus without dysplasia: K22.70

## 2021-09-27 HISTORY — DX: Atherosclerotic heart disease of native coronary artery without angina pectoris: I25.10

## 2021-09-27 NOTE — Progress Notes (Signed)
Cardiac Individual Treatment Plan ? ?Patient Details  ?Name: Zachary Heath Zachary Heath ?MRN: [id number] ?Date of Birth: 09-Oct-1945 ?Referring Provider:   ?Flowsheet Row CARDIAC REHAB PHASE II ORIENTATION from 09/27/2021 in [facility] CARDIAC REHAB  ?Referring Provider Dr Pete Pelt MD, MS, [facility]/ Dr Armanda Magic MD,Covering  ? ?  ? ? ?Initial Encounter Date:  ?Flowsheet Row CARDIAC REHAB PHASE II ORIENTATION from 09/27/2021 in [facility] CARDIAC REHAB  ?Date 09/27/21  ? ?  ? ? 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 ?Patient's Home Medications on Admission: ? ?Current Outpatient Medications:  ?  aspirin EC 81 MG tablet, Take 162 mg by mouth daily. Swallow whole., Disp: , Rfl:  ?  Carbidopa 25 MG tablet, Take 25 mg by mouth 4 (four) times daily., Disp: , Rfl:  ?  carbidopa-levodopa (SINEMET CR) 50-200 MG tablet, Take 1 tablet by mouth at bedtime., Disp: , Rfl:  ?  carbidopa-levodopa (SINEMET IR) 25-100 MG tablet, Take 1 tablet by mouth 4 (four) times daily. , Disp: , Rfl:  ?  Carboxymethylcellul-Glycerin (LUBRICATING EYE DROPS OP), Place 1 drop into both eyes 2 (two) times daily., Disp: , Rfl:  ?  cetirizine (ZYRTEC) 10 MG tablet, Take 10 mg by mouth daily., Disp: , Rfl:  ?  Cholecalciferol (VITAMIN D) 50 MCG (2000 UT) tablet, Take 2,000 Units by mouth daily., Disp: , Rfl:  ?  ferrous sulfate 325 (65 FE) MG tablet, Take 325 mg by mouth daily., Disp: , Rfl:  ?  folic acid (FOLVITE) 400 MCG tablet, Take 400 mcg by mouth daily., Disp: , Rfl:  ?  furosemide (LASIX) 40 MG tablet, Take 40 mg by mouth every other day., Disp: , Rfl:  ?  magnesium oxide (MAG-OX) 400 MG tablet, Take 400 mg by mouth daily., Disp: , Rfl:  ?  metoprolol tartrate (LOPRESSOR) 50 MG tablet, Take 50 mg by mouth 2 (two) times daily., Disp: , Rfl:  ?  pantoprazole (PROTONIX) 40 MG tablet, Take 40 mg by mouth 2 (two) times daily., Disp: , Rfl:  ?  rasagiline (AZILECT) 1 MG TABS tablet, Take 1 mg by mouth daily., Disp: ,  Rfl:  ?  rOPINIRole (REQUIP) 1 MG tablet, Take 1 mg by mouth in the morning, at noon, in the evening, and at bedtime., Disp: , Rfl:  ?  rosuvastatin (CRESTOR) 40 MG tablet, Take 40 mg by mouth daily., Disp: , Rfl:  ?  zinc gluconate 50 MG tablet, Take 50 mg by mouth daily., Disp: , Rfl:  ? ?Past Medical History: ?Past Medical History:  ?Diagnosis Date  ? Barrett's esophagus   ? Coronary artery disease   ? GERD (gastroesophageal reflux disease)   ? Glossodynia   ? Hypertension   ? Parkinson's disease (HCC)   ? ? ?Tobacco Use: ?Social History  ? ?Tobacco Use  ?Smoking Status Never  ?Smokeless Tobacco Never  ? ? ?Labs: ?Recent Review Flowsheet Data   ?There is no flowsheet data to display. ?  ? ? ?Capillary Blood Glucose: ?No results found for: GLUCAP ? ? ?Exercise Target Goals: ?Exercise Program Goal: ?Individual exercise prescription set using results from initial 6 min walk test and THRR while considering  patient?s activity barriers and safety.  ? ?Exercise Prescription Goal: ?Initial exercise prescription builds to 30-45 minutes a day of aerobic activity, 2-3 days per week.  Home exercise guidelines will be given to patient during program as part of exercise prescription that the participant will acknowledge. ? ?Activity  Barriers & Risk Stratification: ? Activity Barriers & Cardiac Risk Stratification - 09/27/21 1059   ? ?  ? Activity Barriers & Cardiac Risk Stratification  ? Activity Barriers Back Problems;Balance Concerns;Other (comment)   ? Comments Parkinson's disease, chronic back pain, right sciatic nerve pain.   ? Cardiac Risk Stratification High   ? ?  ?  ? ?  ? ? ?6 Minute Walk: ? 6 Minute Walk   ? ? Row Name 09/27/21 1153  ?  ?  ?  ? 6 Minute Walk  ? Phase Initial    ? Distance 1434 feet    ? Walk Time 6 minutes    ? # of Rest Breaks 0    ? MPH 2.71    ? METS 2.76    ? RPE 9    ? Perceived Dyspnea  1    ? VO2 Peak 9.65    ? Symptoms Yes (comment)    ? Comments Mild SOB, 1/4 on the dyspnea scale. Chronic  back pain, 3/10 on the pain scale.    ? Resting HR 67 bpm    ? Resting BP 118/70    ? Resting Oxygen Saturation  99 %    ? Exercise Oxygen Saturation  during 6 min walk 96 %    ? Max Ex. HR 83 bpm    ? Max Ex. BP 162/82    ? 2 Minute Post BP 118/78    ? ?  ?  ? ?  ? ? ?Oxygen Initial Assessment: ? ? ?Oxygen Re-Evaluation: ? ? ?Oxygen Discharge (Final Oxygen Re-Evaluation): ? ? ?Initial Exercise Prescription: ? Initial Exercise Prescription - 09/27/21 1200   ? ?  ? Date of Initial Exercise RX and Referring Provider  ? Date 09/27/21   ? Referring Provider Dr Pete PeltWalter Tan MD, MS, [facility] [facility]/ Dr Armanda Magicraci Turner MD,Covering   ? Expected Discharge Date 11/25/21   ?  ? Recumbant Bike  ? Level 2   ? Minutes 15   ? METs 2.6   ?  ? NuStep  ? Level 2   ? SPM 85   ? Minutes 15   ? METs 2.6   ?  ? Prescription Details  ? Frequency (times per week) 3   ? Duration Progress to 30 minutes of continuous aerobic without signs/symptoms of physical distress   ?  ? Intensity  ? THRR 40-80% of Max Heartrate 57-114   ? Ratings of Perceived Exertion 11-13   ? Perceived Dyspnea 0-4   ?  ? Progression  ? Progression Continue to progress workloads to maintain intensity without signs/symptoms of physical distress.   ?  ? Resistance Training  ? Training Prescription Yes   ? Weight 4 lbs   ? Reps 10-15   ? ?  ?  ? ?  ? ? ?Perform Capillary Blood Glucose checks as needed. ? ?Exercise Prescription Changes: ? ? ?Exercise Comments: ? ? ?Exercise Goals and Review: ? ? Exercise Goals   ? ? Row Name 09/27/21 1018  ?  ?  ?  ?  ?  ? Exercise Goals  ? Increase Physical Activity Yes      ? Intervention Provide advice, education, support and counseling about physical activity/exercise needs.;Develop an individualized exercise prescription for aerobic and resistive training based on initial evaluation findings, risk stratification, comorbidities and participant's personal goals.      ? Expected Outcomes Short Term: Attend rehab on a regular basis to increase  amount of physical activity.;Long  Term: Exercising regularly at least 3-5 days a week.;Long Term: Add in home exercise to make exercise part of routine and to increase amount of physical activity.      ? Increase Strength and Stamina Yes      ? Intervention Provide advice, education, support and counseling about physical activity/exercise needs.;Develop an individualized exercise prescription for aerobic and resistive training based on initial evaluation findings, risk stratification, comorbidities and participant's personal goals.      ? Expected Outcomes Short Term: Increase workloads from initial exercise prescription for resistance, speed, and METs.;Short Term: Perform resistance training exercises routinely during rehab and add in resistance training at home;Long Term: Improve cardiorespiratory fitness, muscular endurance and strength as measured by increased METs and functional capacity ( )      ? Able to understand and use rate of perceived exertion (RPE) scale Yes      ? Intervention Provide education and explanation on how to use RPE scale      ? Expected Outcomes Short Term: Able to use RPE daily in rehab to express subjective intensity level;Long Term:  Able to use RPE to guide intensity level when exercising independently      ? Knowledge and understanding of Target Heart Rate Range (THRR) Yes      ? Intervention Provide education and explanation of THRR including how the numbers were predicted and where they are located for reference      ? Expected Outcomes Short Term: Able to state/look up THRR;Short Term: Able to use daily as guideline for intensity in rehab;Long Term: Able to use THRR to govern intensity when exercising independently      ? Able to check pulse independently Yes      ? Intervention Provide education and demonstration on how to check pulse in carotid and radial arteries.;Review the importance of being able to check your own pulse for safety during independent exercise      ?  Expected Outcomes Long Term: Able to check pulse independently and accurately;Short Term: Able to explain why pulse checking is important during independent exercise      ? Understanding of Exercise Prescr

## 2021-09-27 NOTE — Progress Notes (Signed)
Cardiac Rehab Medication Review by a Pharmacist ? ?Does the patient  feel that his/her medications are working for him/her?  YES  ? ?Has the patient been experiencing any side effects to the medications prescribed?   NO ? ?Does the patient measure his/her own blood pressure or blood glucose at home?  YES  ? ?Does the patient have any problems obtaining medications due to transportation or finances?    NO ? ?Understanding of regimen: fair ?Understanding of indications: fair ?Potential of compliance: good ? ? ? ?Nurse comments: Zachary Heath is taking his medications as prescribed and has a fair understanding of what his medications are for. Zachary Heath has a BP cuff but does not check his blood pressure every day. ? ? ? ?Zachary Headings RN ?09/27/2021 2:26 PM ?  ?

## 2021-10-03 ENCOUNTER — Other Ambulatory Visit: Payer: Self-pay

## 2021-10-03 ENCOUNTER — Encounter (HOSPITAL_COMMUNITY)
Admission: RE | Admit: 2021-10-03 | Discharge: 2021-10-03 | Disposition: A | Discharge status: Home / Self Care | Payer: No Typology Code available for payment source | Source: Ambulatory Visit | Attending: Cardiology | Admitting: Cardiology

## 2021-10-03 DIAGNOSIS — Z951 Presence of aortocoronary bypass graft: Secondary | ICD-10-CM | POA: Diagnosis present

## 2021-10-03 NOTE — Progress Notes (Signed)
Daily Session Note ? ?Patient Details  ?Name: Zachary Heath ?MRN: [id number] ?Date of Birth: 1946/03/07 ?Referring Provider:   ?Flowsheet Row CARDIAC REHAB PHASE II ORIENTATION from 09/27/2021 in [place]  ?Referring Provider Dr Carlis Stable MD, MS, [facility]/ Dr Fransico Him MD,Covering  ? ?  ? ? ?Encounter Date: 10/03/2021 ? ?Check In: ? Session Check In - 10/03/21 1050   ? ?  ? Check-In  ? Supervising physician immediately available to respond to emergencies [facility] immediately available   ? Physician(s) Dr. Cruzita Lederer   ? Location [facility]   ? Staff Present Esmeralda Links BS, ACSM EP-C, Exercise Physiologist;Kyrie Bun, RN, Deland Pretty, MS, ACSM CEP, Exercise Physiologist;David Makemson, MS, ACSM-CEP, CCRP, Exercise Physiologist   ? Virtual Visit No   ? Medication changes reported     No   ? Fall or balance concerns reported    No   ? Tobacco Cessation No Change   ? Warm-up and Cool-down Performed as group-led instruction   ? Resistance Training Performed Yes   ? VAD Patient? No   ? PAD/SET Patient? No   ?  ? Pain Assessment  ? Currently in Pain? No/denies   ? Pain Score 0-No pain   ? Multiple Pain Sites No   ? ?  ?  ? ?  ? ? ?Capillary Blood Glucose: ?No results found for this or any previous visit (from the past 24 hour(s)). ? ? Exercise Prescription Changes - 10/03/21 1026   ? ?  ? Response to Exercise  ? Blood Pressure (Admit) 132/84   ? Blood Pressure (Exercise) 128/70   ? Blood Pressure (Exit) 102/66   ? Heart Rate (Admit) 62 bpm   ? Heart Rate (Exercise) 77 bpm   ? Heart Rate (Exit) 66 bpm   ? Rating of Perceived Exertion (Exercise) 13   ? Symptoms None   ? Comments Off to a good start with exercise.   ? Duration Continue with 30 min of aerobic exercise without signs/symptoms of physical distress.   ? Intensity THRR unchanged   ?  ? Progression  ? Progression Continue to progress workloads to maintain intensity without  signs/symptoms of physical distress.   ? Average METs 2.1   ?  ? Resistance Training  ? Weight 4 lbs   ? Reps 10-15   ? Time 10 Minutes   ?  ? Interval Training  ? Interval Training No   ?  ? Recumbant Bike  ? Level 2   ? Minutes 15   ? METs 2   ?  ? NuStep  ? Level 2   ? SPM 85   ? Minutes 15   ? METs 2.2   ? ?  ?  ? ?  ? ? ?Social History  ? ?Tobacco Use  ?Smoking Status Never  ?Smokeless Tobacco Never  ? ? ?Goals Met:  ?Exercise tolerated well ?No report of concerns or symptoms today ?Strength training completed today ? ?Goals Unmet:  ?Not Applicable ? ?Comments: Zachary Heath started cardiac rehab today.  Pt tolerated light exercise without difficulty. VSS, telemetry-Sinus Rhythm with PAC's, asymptomatic.  Medication list reconciled. Pt denies barriers to medicaiton compliance.  PSYCHOSOCIAL ASSESSMENT:  PHQ-0. Pt exhibits positive coping skills, hopeful outlook with supportive family. No psychosocial needs identified at this time, no psychosocial interventions necessary.    Pt enjoys watching sports.   Pt oriented to exercise equipment and routine.    Understanding verbalized. Zachary Frederickson  Venetia Maxon, RN,BSN ?10/03/2021 12:10 PM  ? ? ?Dr. Fransico Him is Medical Director for Cardiac Rehab at [facility]. ?

## 2021-10-05 ENCOUNTER — Other Ambulatory Visit: Payer: Self-pay

## 2021-10-05 ENCOUNTER — Encounter (HOSPITAL_COMMUNITY)
Admission: RE | Admit: 2021-10-05 | Discharge: 2021-10-05 | Disposition: A | Discharge status: Home / Self Care | Payer: No Typology Code available for payment source | Source: Ambulatory Visit | Attending: Cardiology | Admitting: Cardiology

## 2021-10-05 DIAGNOSIS — Z951 Presence of aortocoronary bypass graft: Secondary | ICD-10-CM | POA: Diagnosis not present

## 2021-10-07 ENCOUNTER — Other Ambulatory Visit: Payer: Self-pay

## 2021-10-07 ENCOUNTER — Encounter (HOSPITAL_COMMUNITY)
Admission: RE | Admit: 2021-10-07 | Discharge: 2021-10-07 | Disposition: A | Discharge status: Home / Self Care | Payer: No Typology Code available for payment source | Source: Ambulatory Visit | Attending: Cardiology | Admitting: Cardiology

## 2021-10-07 DIAGNOSIS — Z951 Presence of aortocoronary bypass graft: Secondary | ICD-10-CM | POA: Diagnosis not present

## 2021-10-10 ENCOUNTER — Other Ambulatory Visit: Payer: Self-pay

## 2021-10-10 ENCOUNTER — Encounter (HOSPITAL_COMMUNITY)
Admission: RE | Admit: 2021-10-10 | Discharge: 2021-10-10 | Disposition: A | Discharge status: Home / Self Care | Payer: No Typology Code available for payment source | Source: Ambulatory Visit | Attending: Cardiology | Admitting: Cardiology

## 2021-10-10 DIAGNOSIS — Z951 Presence of aortocoronary bypass graft: Secondary | ICD-10-CM | POA: Diagnosis not present

## 2021-10-12 ENCOUNTER — Other Ambulatory Visit: Payer: Self-pay

## 2021-10-12 ENCOUNTER — Encounter (HOSPITAL_COMMUNITY)
Admission: RE | Admit: 2021-10-12 | Discharge: 2021-10-12 | Disposition: A | Discharge status: Home / Self Care | Payer: No Typology Code available for payment source | Source: Ambulatory Visit | Attending: Cardiology | Admitting: Cardiology

## 2021-10-12 DIAGNOSIS — Z951 Presence of aortocoronary bypass graft: Secondary | ICD-10-CM | POA: Diagnosis not present

## 2021-10-14 ENCOUNTER — Encounter (HOSPITAL_COMMUNITY)
Admission: RE | Admit: 2021-10-14 | Discharge: 2021-10-14 | Disposition: A | Discharge status: Home / Self Care | Payer: No Typology Code available for payment source | Source: Ambulatory Visit | Attending: Cardiology | Admitting: Cardiology

## 2021-10-14 DIAGNOSIS — Z951 Presence of aortocoronary bypass graft: Secondary | ICD-10-CM

## 2021-10-17 ENCOUNTER — Encounter (HOSPITAL_COMMUNITY)
Admission: RE | Admit: 2021-10-17 | Discharge: 2021-10-17 | Disposition: A | Discharge status: Home / Self Care | Payer: No Typology Code available for payment source | Source: Ambulatory Visit | Attending: Cardiology | Admitting: Cardiology

## 2021-10-17 DIAGNOSIS — I2510 Atherosclerotic heart disease of native coronary artery without angina pectoris: Secondary | ICD-10-CM | POA: Diagnosis not present

## 2021-10-17 DIAGNOSIS — G20 Parkinson's disease: Secondary | ICD-10-CM | POA: Diagnosis not present

## 2021-10-17 DIAGNOSIS — Z951 Presence of aortocoronary bypass graft: Secondary | ICD-10-CM | POA: Diagnosis not present

## 2021-10-17 DIAGNOSIS — I10 Essential (primary) hypertension: Secondary | ICD-10-CM | POA: Insufficient documentation

## 2021-10-17 DIAGNOSIS — E638 Other specified nutritional deficiencies: Secondary | ICD-10-CM | POA: Insufficient documentation

## 2021-10-17 NOTE — Progress Notes (Signed)
Reviewed home exercise guidelines with patient including endpoints, temperature precautions, target heart rate and rate of perceived exertion. Patient is currently walking about 15 minutes when the weather permits as his mode of home exercise. Discussed increasing duration, and patient is amenable to this. Walking is limited by leg stiffness from Parkinson's and back pain. Patient jogged in the past and is interested in walking on the TM on some days at cardiac rehab. Patient voices understanding of instructions given. ? ?Artist Pais, MS, ACSM CEP ? ?

## 2021-10-19 ENCOUNTER — Encounter (HOSPITAL_COMMUNITY)
Admission: RE | Admit: 2021-10-19 | Discharge: 2021-10-19 | Disposition: A | Discharge status: Home / Self Care | Payer: No Typology Code available for payment source | Source: Ambulatory Visit | Attending: Cardiology | Admitting: Cardiology

## 2021-10-19 DIAGNOSIS — Z951 Presence of aortocoronary bypass graft: Secondary | ICD-10-CM

## 2021-10-21 ENCOUNTER — Encounter (HOSPITAL_COMMUNITY)
Admission: RE | Admit: 2021-10-21 | Discharge: 2021-10-21 | Disposition: A | Discharge status: Home / Self Care | Payer: No Typology Code available for payment source | Source: Ambulatory Visit | Attending: Cardiology | Admitting: Cardiology

## 2021-10-21 DIAGNOSIS — Z951 Presence of aortocoronary bypass graft: Secondary | ICD-10-CM

## 2021-10-21 NOTE — Progress Notes (Signed)
Zachary Heath 76 y.o. male ?Nutrition Note ?He is motivated to make lifestyle changes to aid with cardiac/pulmonary rehab. Patient has medical history of CABGx3, Parkinson's disease, HTN, CAD. He lives at home with his daughter and her four children. Many members of the household do the cooking and his daughter does the grocery shopping. He reports his diet consist of a significant amount of meat, rice and beans. He eats very little fruit and vegetables but is willing to change this. He drinks chocolate soy milk 1-2x/day and water.  ? ?Nutrition Diagnosis: ?Inadequate fiber intake related to lack of previous education about dietary fiber as evidenced by estimated daily fiber intake of 15 g. ? ?Nutrition Intervention ?Pt?s individual nutrition plan reviewed with pt. ?Benefits of adopting Heart Healthy diet discussed.  ?Continue client-centered nutrition education by RD, as part of interdisciplinary care. ? ?Monitor/Evaluation: ?Patient reports motivation to make lifestyle changes for adherence to heart healthy diet recommendation. We discussed increasing vegetable and fruit intake. Handouts/notes given. Patient amicable to RD suggestions and verbalizes understanding. Will follow-up as needed.  ? ?15 minutes spent in review of topics related to a heart healthy diet including sodium intake, blood sugar control, weight management, and fiber intake. ? ?Goal(s) ?Pt to identify and limit food sources of saturated fat, trans fat, refined carbohydrates and sodium ?Pt able to name foods that affect blood glucose. Continue to limit simple sugars, refined carbohydrates, sugary beverages, etc.  ?Pt to describe the benefit of including lean protein/plant proteins, fruits, vegetables, whole grains, nuts/seeds, and low-fat dairy products in a heart healthy meal plan. ?Patient to increase fiber intake by incorporating fruits and vegetables daily. Will use the plate method as a guide for meal planning. ?Pt to practice mindful  and intuitive eating exercises ? ?Plan:  ?Pt to attend nutrition classes  ?Will provide client-centered nutrition education as part of interdisciplinary care ?Monitor and evaluate progress toward nutrition goal with team. ? ? ?Athalee Esterline Belarus, MS, RDN, LDN  ?

## 2021-10-24 ENCOUNTER — Encounter (HOSPITAL_COMMUNITY)
Admission: RE | Admit: 2021-10-24 | Discharge: 2021-10-24 | Disposition: A | Discharge status: Home / Self Care | Payer: No Typology Code available for payment source | Source: Ambulatory Visit | Attending: Cardiology | Admitting: Cardiology

## 2021-10-24 DIAGNOSIS — Z951 Presence of aortocoronary bypass graft: Secondary | ICD-10-CM | POA: Diagnosis not present

## 2021-10-25 NOTE — Progress Notes (Signed)
Cardiac Individual Treatment Plan ? ?Patient Details  ?Name: Zachary Heath ?MRN: [id number] ?Date of Birth: 1946/05/23 ?Referring Provider:   ?Flowsheet Row CARDIAC REHAB PHASE II ORIENTATION from 09/27/2021 in [facility] CARDIAC REHAB  ?Referring Provider Dr Pete Pelt MD, MS, [facility]/ Dr Armanda Magic MD,Covering  ? ?  ? ? ?Initial Encounter Date:  ?Flowsheet Row CARDIAC REHAB PHASE II ORIENTATION from 09/27/2021 in [facility] CARDIAC REHAB  ?Date 09/27/21  ? ?  ? ? 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 ?Patient's Home Medications on Admission: ? ?Current Outpatient Medications:  ?  aspirin EC 81 MG tablet, Take 162 mg by mouth daily. Swallow whole., Disp: , Rfl:  ?  Carbidopa 25 MG tablet, Take 25 mg by mouth 4 (four) times daily., Disp: , Rfl:  ?  carbidopa-levodopa (SINEMET CR) 50-200 MG tablet, Take 1 tablet by mouth at bedtime., Disp: , Rfl:  ?  carbidopa-levodopa (SINEMET IR) 25-100 MG tablet, Take 1 tablet by mouth 4 (four) times daily. , Disp: , Rfl:  ?  Carboxymethylcellul-Glycerin (LUBRICATING EYE DROPS OP), Place 1 drop into both eyes 2 (two) times daily., Disp: , Rfl:  ?  cetirizine (ZYRTEC) 10 MG tablet, Take 10 mg by mouth daily., Disp: , Rfl:  ?  Cholecalciferol (VITAMIN D) 50 MCG (2000 UT) tablet, Take 2,000 Units by mouth daily., Disp: , Rfl:  ?  ferrous sulfate 325 (65 FE) MG tablet, Take 325 mg by mouth daily., Disp: , Rfl:  ?  folic acid (FOLVITE) 400 MCG tablet, Take 400 mcg by mouth daily., Disp: , Rfl:  ?  furosemide (LASIX) 40 MG tablet, Take 40 mg by mouth every other day., Disp: , Rfl:  ?  magnesium oxide (MAG-OX) 400 MG tablet, Take 400 mg by mouth daily., Disp: , Rfl:  ?  metoprolol tartrate (LOPRESSOR) 50 MG tablet, Take 50 mg by mouth 2 (two) times daily., Disp: , Rfl:  ?  pantoprazole (PROTONIX) 40 MG tablet, Take 40 mg by mouth 2 (two) times daily., Disp: , Rfl:  ?  rasagiline (AZILECT) 1 MG TABS tablet, Take 1 mg by mouth daily., Disp: ,  Rfl:  ?  rOPINIRole (REQUIP) 1 MG tablet, Take 1 mg by mouth in the morning, at noon, in the evening, and at bedtime., Disp: , Rfl:  ?  rosuvastatin (CRESTOR) 40 MG tablet, Take 40 mg by mouth daily., Disp: , Rfl:  ?  zinc gluconate 50 MG tablet, Take 50 mg by mouth daily., Disp: , Rfl:  ? ?Past Medical History: ?Past Medical History:  ?Diagnosis Date  ? Barrett's esophagus   ? Coronary artery disease   ? GERD (gastroesophageal reflux disease)   ? Glossodynia   ? Hypertension   ? Parkinson's disease (HCC)   ? ? ?Tobacco Use: ?Social History  ? ?Tobacco Use  ?Smoking Status Never  ?Smokeless Tobacco Never  ? ? ?Labs: ?Review Flowsheet   ? ?    ? View : No data to display.  ?  ?  ?  ?  ?  ? ? ?Capillary Blood Glucose: ?No results found for: GLUCAP ? ? ?Exercise Target Goals: ?Exercise Program Goal: ?Individual exercise prescription set using results from initial 6 min walk test and THRR while considering  patient?s activity barriers and safety.  ? ?Exercise Prescription Goal: ?Initial exercise prescription builds to 30-45 minutes a day of aerobic activity, 2-3 days per week.  Home exercise guidelines will be given to patient during program  as part of exercise prescription that the participant will acknowledge. ? ?Activity Barriers & Risk Stratification: ? Activity Barriers & Cardiac Risk Stratification - 09/27/21 1059   ? ?  ? Activity Barriers & Cardiac Risk Stratification  ? Activity Barriers Back Problems;Balance Concerns;Other (comment)   ? Comments Parkinson's disease, chronic back pain, right sciatic nerve pain.   ? Cardiac Risk Stratification High   ? ?  ?  ? ?  ? ? ?6 Minute Walk: ? 6 Minute Walk   ? ? Row Name 09/27/21 1153  ?  ?  ?  ? 6 Minute Walk  ? Phase Initial    ? Distance 1434 feet    ? Walk Time 6 minutes    ? # of Rest Breaks 0    ? MPH 2.71    ? METS 2.76    ? RPE 9    ? Perceived Dyspnea  1    ? VO2 Peak 9.65    ? Symptoms Yes (comment)    ? Comments Mild SOB, 1/4 on the dyspnea scale. Chronic  back pain, 3/10 on the pain scale.    ? Resting HR 67 bpm    ? Resting BP 118/70    ? Resting Oxygen Saturation  99 %    ? Exercise Oxygen Saturation  during 6 min walk 96 %    ? Max Ex. HR 83 bpm    ? Max Ex. BP 162/82    ? 2 Minute Post BP 118/78    ? ?  ?  ? ?  ? ? ?Oxygen Initial Assessment: ? ? ?Oxygen Re-Evaluation: ? ? ?Oxygen Discharge (Final Oxygen Re-Evaluation): ? ? ?Initial Exercise Prescription: ? Initial Exercise Prescription - 09/27/21 1200   ? ?  ? Date of Initial Exercise RX and Referring Provider  ? Date 09/27/21   ? Referring Provider Dr Pete Pelt MD, MS, [facility]/ Dr Armanda Magic MD,Covering   ? Expected Discharge Date 11/25/21   ?  ? Recumbant Bike  ? Level 2   ? Minutes 15   ? METs 2.6   ?  ? NuStep  ? Level 2   ? SPM 85   ? Minutes 15   ? METs 2.6   ?  ? Prescription Details  ? Frequency (times per week) 3   ? Duration Progress to 30 minutes of continuous aerobic without signs/symptoms of physical distress   ?  ? Intensity  ? THRR 40-80% of Max Heartrate 57-114   ? Ratings of Perceived Exertion 11-13   ? Perceived Dyspnea 0-4   ?  ? Progression  ? Progression Continue to progress workloads to maintain intensity without signs/symptoms of physical distress.   ?  ? Resistance Training  ? Training Prescription Yes   ? Weight 4 lbs   ? Reps 10-15   ? ?  ?  ? ?  ? ? ?Perform Capillary Blood Glucose checks as needed. ? ?Exercise Prescription Changes: ? ? Exercise Prescription Changes   ? ? Row Name 10/03/21 1026 10/10/21 1014 10/24/21 1018  ?  ?  ?  ? Response to Exercise  ? Blood Pressure (Admit) 132/84 168/80 120/72    ? Blood Pressure (Exercise) 128/70 152/80 138/82    ? Blood Pressure (Exit) 102/66 124/80 128/68    ? Heart Rate (Admit) 62 bpm 68 bpm 67 bpm    ? Heart Rate (Exercise) 77 bpm 91 bpm 99 bpm    ? Heart Rate (Exit) 66 bpm  64 bpm 64 bpm    ? Rating of Perceived Exertion (Exercise) 13 11 11     ? Symptoms None None None    ? Comments Off to a good start with exercise. -- --    ?  Duration Continue with 30 min of aerobic exercise without signs/symptoms of physical distress. Continue with 30 min of aerobic exercise without signs/symptoms of physical distress. Continue with 30 min of aerobic exercise without signs/symptoms of physical distress.    ? Intensity THRR unchanged THRR unchanged THRR unchanged    ?  ? Progression  ? Progression Continue to progress workloads to maintain intensity without signs/symptoms of physical distress. Continue to progress workloads to maintain intensity without signs/symptoms of physical distress. Continue to progress workloads to maintain intensity without signs/symptoms of physical distress.    ? Average METs 2.1 1.9 2.2    ?  ? Resistance Training  ? Training Prescription -- Yes Yes    ? Weight 4 lbs 4 lbs 4 lbs    ? Reps 10-15 10-15 10-15    ? Time 10 Minutes 10 Minutes 10 Minutes    ?  ? Interval Training  ? Interval Training No No No    ?  ? Recumbant Bike  ? Level 2 2 3.5    ? Minutes 15 15 15     ? METs 2 2 2.6    ?  ? NuStep  ? Level 2 3  Increased WL today 4    ? SPM 85 85 85    ? Minutes 15 15 15     ? METs 2.2 1.8 1.9    ?  ? Home Exercise Plan  ? Plans to continue exercise at -- -- Home (comment)  Walking    ? Frequency -- -- Add 2 additional days to program exercise sessions.    ? Initial Home Exercises Provided -- -- 10/17/21    ? ?  ?  ? ?  ? ? ?Exercise Comments: ? ? Exercise Comments   ? ? Row Name 10/03/21 1131 10/12/21 1113 10/17/21 1040 10/24/21 1108  ?  ? Exercise Comments Patient tolerated low intensity exercise well without symptoms. Reviewed METs with patient. Reviewed home exercise guidelines, METs, and goals with patient. Reviewed METs with patient.   ? ?  ?  ? ?  ? ? ?Exercise Goals and Review: ? ? Exercise Goals   ? ? Row Name 09/27/21 1018  ?  ?  ?  ?  ?  ? Exercise Goals  ? Increase Physical Activity Yes      ? Intervention Provide advice, education, support and counseling about physical activity/exercise needs.;Develop an  individualized exercise prescription for aerobic and resistive training based on initial evaluation findings, risk stratification, comorbidities and participant's personal goals.      ? Expected Outcomes Short Ter

## 2021-10-26 ENCOUNTER — Encounter (HOSPITAL_COMMUNITY)
Admission: RE | Admit: 2021-10-26 | Discharge: 2021-10-26 | Disposition: A | Discharge status: Home / Self Care | Payer: No Typology Code available for payment source | Source: Ambulatory Visit | Attending: Cardiology | Admitting: Cardiology

## 2021-10-26 DIAGNOSIS — Z951 Presence of aortocoronary bypass graft: Secondary | ICD-10-CM | POA: Diagnosis not present

## 2021-10-28 ENCOUNTER — Encounter (HOSPITAL_COMMUNITY)
Admission: RE | Admit: 2021-10-28 | Discharge: 2021-10-28 | Disposition: A | Discharge status: Home / Self Care | Payer: No Typology Code available for payment source | Source: Ambulatory Visit | Attending: Cardiology | Admitting: Cardiology

## 2021-10-28 DIAGNOSIS — Z951 Presence of aortocoronary bypass graft: Secondary | ICD-10-CM

## 2021-10-31 ENCOUNTER — Encounter (HOSPITAL_COMMUNITY)
Admission: RE | Admit: 2021-10-31 | Discharge: 2021-10-31 | Disposition: A | Discharge status: Home / Self Care | Payer: No Typology Code available for payment source | Source: Ambulatory Visit | Attending: Cardiology | Admitting: Cardiology

## 2021-10-31 DIAGNOSIS — Z951 Presence of aortocoronary bypass graft: Secondary | ICD-10-CM | POA: Diagnosis not present

## 2021-11-02 ENCOUNTER — Encounter (HOSPITAL_COMMUNITY)
Admission: RE | Admit: 2021-11-02 | Discharge: 2021-11-02 | Disposition: A | Discharge status: Home / Self Care | Payer: No Typology Code available for payment source | Source: Ambulatory Visit | Attending: Cardiology | Admitting: Cardiology

## 2021-11-02 DIAGNOSIS — Z951 Presence of aortocoronary bypass graft: Secondary | ICD-10-CM

## 2021-11-04 ENCOUNTER — Encounter (HOSPITAL_COMMUNITY)
Admission: RE | Admit: 2021-11-04 | Discharge: 2021-11-04 | Disposition: A | Discharge status: Home / Self Care | Payer: No Typology Code available for payment source | Source: Ambulatory Visit | Attending: Cardiology | Admitting: Cardiology

## 2021-11-04 DIAGNOSIS — Z951 Presence of aortocoronary bypass graft: Secondary | ICD-10-CM | POA: Diagnosis not present

## 2021-11-07 ENCOUNTER — Encounter (HOSPITAL_COMMUNITY)
Admission: RE | Admit: 2021-11-07 | Discharge: 2021-11-07 | Disposition: A | Discharge status: Home / Self Care | Payer: No Typology Code available for payment source | Source: Ambulatory Visit | Attending: Cardiology | Admitting: Cardiology

## 2021-11-07 DIAGNOSIS — Z951 Presence of aortocoronary bypass graft: Secondary | ICD-10-CM

## 2021-11-09 ENCOUNTER — Encounter (HOSPITAL_COMMUNITY)
Admission: RE | Admit: 2021-11-09 | Discharge: 2021-11-09 | Disposition: A | Discharge status: Home / Self Care | Payer: No Typology Code available for payment source | Source: Ambulatory Visit | Attending: Cardiology | Admitting: Cardiology

## 2021-11-09 DIAGNOSIS — Z951 Presence of aortocoronary bypass graft: Secondary | ICD-10-CM

## 2021-11-11 ENCOUNTER — Encounter (HOSPITAL_COMMUNITY)
Admission: RE | Admit: 2021-11-11 | Discharge: 2021-11-11 | Disposition: A | Discharge status: Home / Self Care | Payer: No Typology Code available for payment source | Source: Ambulatory Visit | Attending: Cardiology | Admitting: Cardiology

## 2021-11-11 DIAGNOSIS — Z951 Presence of aortocoronary bypass graft: Secondary | ICD-10-CM

## 2021-11-14 ENCOUNTER — Encounter (HOSPITAL_COMMUNITY)
Admission: RE | Admit: 2021-11-14 | Discharge: 2021-11-14 | Disposition: A | Discharge status: Home / Self Care | Payer: No Typology Code available for payment source | Source: Ambulatory Visit | Attending: Cardiology | Admitting: Cardiology

## 2021-11-14 DIAGNOSIS — Z951 Presence of aortocoronary bypass graft: Secondary | ICD-10-CM | POA: Insufficient documentation

## 2021-11-16 ENCOUNTER — Encounter (HOSPITAL_COMMUNITY)
Admission: RE | Admit: 2021-11-16 | Discharge: 2021-11-16 | Disposition: A | Discharge status: Home / Self Care | Payer: No Typology Code available for payment source | Source: Ambulatory Visit | Attending: Cardiology | Admitting: Cardiology

## 2021-11-16 DIAGNOSIS — Z951 Presence of aortocoronary bypass graft: Secondary | ICD-10-CM | POA: Diagnosis not present

## 2021-11-18 ENCOUNTER — Encounter (HOSPITAL_COMMUNITY)
Admission: RE | Admit: 2021-11-18 | Discharge: 2021-11-18 | Disposition: A | Discharge status: Home / Self Care | Payer: No Typology Code available for payment source | Source: Ambulatory Visit | Attending: Cardiology | Admitting: Cardiology

## 2021-11-18 DIAGNOSIS — Z951 Presence of aortocoronary bypass graft: Secondary | ICD-10-CM

## 2021-11-21 ENCOUNTER — Encounter (HOSPITAL_COMMUNITY)
Admission: RE | Admit: 2021-11-21 | Discharge: 2021-11-21 | Disposition: A | Discharge status: Home / Self Care | Payer: No Typology Code available for payment source | Source: Ambulatory Visit | Attending: Cardiology | Admitting: Cardiology

## 2021-11-21 DIAGNOSIS — Z951 Presence of aortocoronary bypass graft: Secondary | ICD-10-CM | POA: Diagnosis not present

## 2021-11-21 NOTE — Progress Notes (Signed)
Cardiac Individual Treatment Plan ? ?Patient Details  ?Name: Zachary Heath ?MRN: [id number] ?Date of Birth: 1946/05/23 ?Referring Provider:   ?Flowsheet Row CARDIAC REHAB PHASE II ORIENTATION from 09/27/2021 in [facility] CARDIAC REHAB  ?Referring Provider Dr Pete Pelt MD, MS, [facility]/ Dr Armanda Magic MD,Covering  ? ?  ? ? ?Initial Encounter Date:  ?Flowsheet Row CARDIAC REHAB PHASE II ORIENTATION from 09/27/2021 in [facility] CARDIAC REHAB  ?Date 09/27/21  ? ?  ? ? 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 ?Patient's Home Medications on Admission: ? ?Current Outpatient Medications:  ?  aspirin EC 81 MG tablet, Take 162 mg by mouth daily. Swallow whole., Disp: , Rfl:  ?  Carbidopa 25 MG tablet, Take 25 mg by mouth 4 (four) times daily., Disp: , Rfl:  ?  carbidopa-levodopa (SINEMET CR) 50-200 MG tablet, Take 1 tablet by mouth at bedtime., Disp: , Rfl:  ?  carbidopa-levodopa (SINEMET IR) 25-100 MG tablet, Take 1 tablet by mouth 4 (four) times daily. , Disp: , Rfl:  ?  Carboxymethylcellul-Glycerin (LUBRICATING EYE DROPS OP), Place 1 drop into both eyes 2 (two) times daily., Disp: , Rfl:  ?  cetirizine (ZYRTEC) 10 MG tablet, Take 10 mg by mouth daily., Disp: , Rfl:  ?  Cholecalciferol (VITAMIN D) 50 MCG (2000 UT) tablet, Take 2,000 Units by mouth daily., Disp: , Rfl:  ?  ferrous sulfate 325 (65 FE) MG tablet, Take 325 mg by mouth daily., Disp: , Rfl:  ?  folic acid (FOLVITE) 400 MCG tablet, Take 400 mcg by mouth daily., Disp: , Rfl:  ?  furosemide (LASIX) 40 MG tablet, Take 40 mg by mouth every other day., Disp: , Rfl:  ?  magnesium oxide (MAG-OX) 400 MG tablet, Take 400 mg by mouth daily., Disp: , Rfl:  ?  metoprolol tartrate (LOPRESSOR) 50 MG tablet, Take 50 mg by mouth 2 (two) times daily., Disp: , Rfl:  ?  pantoprazole (PROTONIX) 40 MG tablet, Take 40 mg by mouth 2 (two) times daily., Disp: , Rfl:  ?  rasagiline (AZILECT) 1 MG TABS tablet, Take 1 mg by mouth daily., Disp: ,  Rfl:  ?  rOPINIRole (REQUIP) 1 MG tablet, Take 1 mg by mouth in the morning, at noon, in the evening, and at bedtime., Disp: , Rfl:  ?  rosuvastatin (CRESTOR) 40 MG tablet, Take 40 mg by mouth daily., Disp: , Rfl:  ?  zinc gluconate 50 MG tablet, Take 50 mg by mouth daily., Disp: , Rfl:  ? ?Past Medical History: ?Past Medical History:  ?Diagnosis Date  ? Barrett's esophagus   ? Coronary artery disease   ? GERD (gastroesophageal reflux disease)   ? Glossodynia   ? Hypertension   ? Parkinson's disease (HCC)   ? ? ?Tobacco Use: ?Social History  ? ?Tobacco Use  ?Smoking Status Never  ?Smokeless Tobacco Never  ? ? ?Labs: ?Review Flowsheet   ? ?    ? View : No data to display.  ?  ?  ?  ?  ?  ? ? ?Capillary Blood Glucose: ?No results found for: GLUCAP ? ? ?Exercise Target Goals: ?Exercise Program Goal: ?Individual exercise prescription set using results from initial 6 min walk test and THRR while considering  patient?s activity barriers and safety.  ? ?Exercise Prescription Goal: ?Initial exercise prescription builds to 30-45 minutes a day of aerobic activity, 2-3 days per week.  Home exercise guidelines will be given to patient during program  as part of exercise prescription that the participant will acknowledge. ? ?Activity Barriers & Risk Stratification: ? Activity Barriers & Cardiac Risk Stratification - 09/27/21 1059   ? ?  ? Activity Barriers & Cardiac Risk Stratification  ? Activity Barriers Back Problems;Balance Concerns;Other (comment)   ? Comments Parkinson's disease, chronic back pain, right sciatic nerve pain.   ? Cardiac Risk Stratification High   ? ?  ?  ? ?  ? ? ?6 Minute Walk: ? 6 Minute Walk   ? ? Row Name 09/27/21 1153 11/21/21 1038  ?  ?  ? 6 Minute Walk  ? Phase Initial Discharge   ? Distance 1434 feet 1472 feet   ? Distance % Change -- 2.65 %   ? Distance Feet Change -- 38 ft   ? Walk Time 6 minutes 6 minutes   ? # of Rest Breaks 0 0   ? MPH 2.71 2.79   ? METS 2.76 2.59   ? RPE 9 11   ? Perceived  Dyspnea  1 0   ? VO2 Peak 9.65 9.07   ? Symptoms Yes (comment) Yes (comment)   ? Comments Mild SOB, 1/4 on the dyspnea scale. Chronic back pain, 3/10 on the pain scale. Bilateral leg pain, chronic "3-4/10"   ? Resting HR 67 bpm 53 bpm   ? Resting BP 118/70 114/72   ? Resting Oxygen Saturation  99 % 97 %   ? Exercise Oxygen Saturation  during 6 min walk 96 % 97 %   ? Max Ex. HR 83 bpm 78 bpm   ? Max Ex. BP 162/82 128/76   ? 2 Minute Post BP 118/78 108/70   ? ?  ?  ? ?  ? ? ?Oxygen Initial Assessment: ? ? ?Oxygen Re-Evaluation: ? ? ?Oxygen Discharge (Final Oxygen Re-Evaluation): ? ? ?Initial Exercise Prescription: ? Initial Exercise Prescription - 09/27/21 1200   ? ?  ? Date of Initial Exercise RX and Referring Provider  ? Date 09/27/21   ? Referring Provider Dr Pete Pelt MD, MS, [facility]/ Dr Armanda Magic MD,Covering   ? Expected Discharge Date 11/25/21   ?  ? Recumbant Bike  ? Level 2   ? Minutes 15   ? METs 2.6   ?  ? NuStep  ? Level 2   ? SPM 85   ? Minutes 15   ? METs 2.6   ?  ? Prescription Details  ? Frequency (times per week) 3   ? Duration Progress to 30 minutes of continuous aerobic without signs/symptoms of physical distress   ?  ? Intensity  ? THRR 40-80% of Max Heartrate 57-114   ? Ratings of Perceived Exertion 11-13   ? Perceived Dyspnea 0-4   ?  ? Progression  ? Progression Continue to progress workloads to maintain intensity without signs/symptoms of physical distress.   ?  ? Resistance Training  ? Training Prescription Yes   ? Weight 4 lbs   ? Reps 10-15   ? ?  ?  ? ?  ? ? ?Perform Capillary Blood Glucose checks as needed. ? ?Exercise Prescription Changes: ? ? Exercise Prescription Changes   ? ? Row Name 10/03/21 1026 10/10/21 1014 10/24/21 1018 11/07/21 1022 11/21/21 1141  ?  ? Response to Exercise  ? Blood Pressure (Admit) 132/84 168/80 120/72 118/60 114/72  ? Blood Pressure (Exercise) 128/70 152/80 138/82 124/72 128/76  ? Blood Pressure (Exit) 102/66 124/80 128/68 114/74 108/70  ? Heart Rate  (  Admit) 62 bpm 68 bpm 67 bpm 60 bpm 53 bpm  ? Heart Rate (Exercise) 77 bpm 91 bpm 99 bpm 81 bpm 78 bpm  ? Heart Rate (Exit) 66 bpm 64 bpm 64 bpm 60 bpm 61 bpm  ? Rating of Perceived Exertion (Exercise) 13 11 11 11 11   ? Symptoms None None None None None  ? Comments Off to a good start with exercise. -- -- -- --  ? Duration Continue with 30 min of aerobic exercise without signs/symptoms of physical distress. Continue with 30 min of aerobic exercise without signs/symptoms of physical distress. Continue with 30 min of aerobic exercise without signs/symptoms of physical distress. Continue with 30 min of aerobic exercise without signs/symptoms of physical distress. Continue with 30 min of aerobic exercise without signs/symptoms of physical distress.  ? Intensity THRR unchanged THRR unchanged THRR unchanged THRR unchanged THRR unchanged  ?  ? Progression  ? Progression Continue to progress workloads to maintain intensity without signs/symptoms of physical distress. Continue to progress workloads to maintain intensity without signs/symptoms of physical distress. Continue to progress workloads to maintain intensity without signs/symptoms of physical distress. Continue to progress workloads to maintain intensity without signs/symptoms of physical distress. Continue to progress workloads to maintain intensity without signs/symptoms of physical distress.  ? Average METs 2.1 1.9 2.2 2.5 3  ?  ? Resistance Training  ? Training Prescription -- Yes Yes Yes Yes  ? Weight 4 lbs 4 lbs 4 lbs 4 lbs 4 lbs  ? Reps 10-15 10-15 10-15 10-15 10-15  ? Time 10 Minutes 10 Minutes 10 Minutes 10 Minutes 10 Minutes  ?  ? Interval Training  ? Interval Training No No No No No  ?  ? Recumbant Bike  ? Level 2 2 3.5 3.5 5  ? Minutes 15 15 15 15 15   ? METs 2 2 2.6 2.2 3  ?  ? NuStep  ? Level 2 3  Increased WL today 4 5 5   ? SPM 85 85 85 85 85  ? Minutes 15 15 15 15 10   ? METs 2.2 1.8 1.9 2.8 2.8  ?  ? Track  ? Laps -- -- -- -- 7  1472 feet  ? Minutes --  -- -- -- 6  6-minute walk test  ? METs -- -- -- -- 3.13  ?  ? Home Exercise Plan  ? Plans to continue exercise at -- -- Home (comment)  Walking Home (comment)  Walking Home (comment)  Walking  ? 

## 2021-11-23 ENCOUNTER — Encounter (HOSPITAL_COMMUNITY)
Admission: RE | Admit: 2021-11-23 | Discharge: 2021-11-23 | Disposition: A | Discharge status: Home / Self Care | Payer: No Typology Code available for payment source | Source: Ambulatory Visit | Attending: Cardiology | Admitting: Cardiology

## 2021-11-23 DIAGNOSIS — Z951 Presence of aortocoronary bypass graft: Secondary | ICD-10-CM | POA: Diagnosis not present

## 2021-11-25 ENCOUNTER — Encounter (HOSPITAL_COMMUNITY)
Admission: RE | Admit: 2021-11-25 | Discharge: 2021-11-25 | Disposition: A | Discharge status: Home / Self Care | Payer: No Typology Code available for payment source | Source: Ambulatory Visit | Attending: Cardiology | Admitting: Cardiology

## 2021-11-25 VITALS — BP 138/82 | HR 65 | Ht 65.25 in | Wt 164.2 lb

## 2021-11-25 DIAGNOSIS — Z951 Presence of aortocoronary bypass graft: Secondary | ICD-10-CM

## 2021-11-25 NOTE — Progress Notes (Signed)
Discharge Progress Report  Patient Details  Name: Zachary Heath MRN: [id number] Date of Birth: [birth year] Referring Provider:   Flowsheet Row CARDIAC REHAB PHASE II ORIENTATION from 09/27/2021 in [facility]  Referring Provider Dr Carlis Stable MD, MS, [facility]/ Dr Fransico Him MD,Covering        Number of Visits: 24  Reason for Discharge:  Patient reached a stable level of exercise. Patient independent in their exercise. Patient has met program and personal goals.  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  ADL UCSD:   Initial Exercise Prescription:  Initial Exercise Prescription - 09/27/21 1200       Date of Initial Exercise RX and Referring Provider   Date 09/27/21    Referring Provider Dr Carlis Stable MD, MS, [facility]/ Dr Fransico Him MD,Covering    Expected Discharge Date 11/25/21      Recumbant Bike   Level 2    Minutes 15    METs 2.6      NuStep   Level 2    SPM 85    Minutes 15    METs 2.6      Prescription Details   Frequency (times per week) 3    Duration Progress to 30 minutes of continuous aerobic without signs/symptoms of physical distress      Intensity   THRR 40-80% of Max Heartrate 57-114    Ratings of Perceived Exertion 11-13    Perceived Dyspnea 0-4      Progression   Progression Continue to progress workloads to maintain intensity without signs/symptoms of physical distress.      Resistance Training   Training Prescription Yes    Weight 4 lbs    Reps 10-15             Discharge Exercise Prescription (Final Exercise Prescription Changes):  Exercise Prescription Changes - 11/25/21 1021       Response to Exercise   Blood Pressure (Admit) 138/82    Blood Pressure (Exercise) 158/82    Blood Pressure (Exit) 130/76    Heart Rate (Admit) 65 bpm    Heart Rate (Exercise) 85 bpm    Heart Rate  (Exit) 57 bpm    Rating of Perceived Exertion (Exercise) 11    Symptoms Chronic low back pain    Comments Last session of cardiac rehab.    Duration Continue with 30 min of aerobic exercise without signs/symptoms of physical distress.    Intensity THRR unchanged      Progression   Progression Continue to progress workloads to maintain intensity without signs/symptoms of physical distress.    Average METs 2.2      Resistance Training   Training Prescription Yes    Weight 4 lbs    Reps 10-15    Time 10 Minutes      Interval Training   Interval Training No      Recumbant Bike   Level 5    Minutes 15    METs 2.9      NuStep   Level 5    SPM 85    Minutes 10    METs 1.6      Home Exercise Plan   Plans to continue exercise at Home (comment)   Walking   Frequency Add 2 additional days to program exercise sessions.    Initial Home Exercises Provided 10/17/21  Functional Capacity:  6 Minute Walk     Row Name 09/27/21 1153 11/21/21 1038       6 Minute Walk   Phase Initial Discharge    Distance 1434 feet 1472 feet    Distance % Change -- 2.65 %    Distance Feet Change -- 38 ft    Walk Time 6 minutes 6 minutes    # of Rest Breaks 0 0    MPH 2.71 2.79    METS 2.76 2.59    RPE 9 11    Perceived Dyspnea  1 0    VO2 Peak 9.65 9.07    Symptoms Yes (comment) Yes (comment)    Comments Mild SOB, 1/4 on the dyspnea scale. Chronic back pain, 3/10 on the pain scale. Bilateral leg pain, chronic "3-4/10"    Resting HR 67 bpm 53 bpm    Resting BP 118/70 114/72    Resting Oxygen Saturation  99 % 97 %    Exercise Oxygen Saturation  during 6 min walk 96 % 97 %    Max Ex. HR 83 bpm 78 bpm    Max Ex. BP 162/82 128/76    2 Minute Post BP 118/78 108/70             Psychological, QOL, Others - Outcomes: PHQ 2/9:    11/25/2021   11:12 AM 09/27/2021    2:24 PM  Depression screen PHQ 2/9  Decreased Interest 0 0  Down, Depressed, Hopeless 0 0  PHQ - 2 Score 0 0     Quality of Life:  Quality of Life - 11/11/21 1655       Quality of Life   Select Quality of Life      Quality of Life Scores   Health/Function Pre 22.93 %    Health/Function Post 23.54 %    Health/Function % Change 2.66 %    Socioeconomic Pre 23.14 %    Socioeconomic Post 24 %    Socioeconomic % Change  3.72 %    Psych/Spiritual Pre 24.29 %    Psych/Spiritual Post 24.64 %    Psych/Spiritual % Change 1.44 %    Family Pre 27.88 %    Family Post 26.88 %    Family % Change -3.59 %    GLOBAL Pre 23.86 %    GLOBAL Post 24.3 %    GLOBAL % Change 1.84 %             Personal Goals: Goals established at orientation with interventions provided to work toward goal.  Personal Goals and Risk Factors at Admission - 09/27/21 1028       Core Components/Risk Factors/Patient Goals on Admission    Weight Management Yes;Weight Loss    Intervention Weight Management: Provide education and appropriate resources to help participant work on and attain dietary goals.;Weight Management: Develop a combined nutrition and exercise program designed to reach desired caloric intake, while maintaining appropriate intake of nutrient and fiber, sodium and fats, and appropriate energy expenditure required for the weight goal.    Admit Weight 165 lb 2 oz (74.9 kg)    Expected Outcomes Short Term: Continue to assess and modify interventions until short term weight is achieved;Long Term: Adherence to nutrition and physical activity/exercise program aimed toward attainment of established weight goal;Weight Loss: Understanding of general recommendations for a balanced deficit meal plan, which promotes 1-2 lb weight loss per week and includes a negative energy balance of [phone] kcal/d    Hypertension Yes  Intervention Provide education on lifestyle modifcations including regular physical activity/exercise, weight management, moderate sodium restriction and increased consumption of fresh fruit, vegetables,  and low fat dairy, alcohol moderation, and smoking cessation.;Monitor prescription use compliance.    Expected Outcomes Short Term: Continued assessment and intervention until BP is < 140/75m HG in hypertensive participants. < 130/841mHG in hypertensive participants with diabetes, heart failure or chronic kidney disease.;Long Term: Maintenance of blood pressure at goal levels.    Lipids Yes    Intervention Provide education and support for participant on nutrition & aerobic/resistive exercise along with prescribed medications to achieve LDL <7013mHDL >53m83m  Expected Outcomes Short Term: Participant states understanding of desired cholesterol values and is compliant with medications prescribed. Participant is following exercise prescription and nutrition guidelines.;Long Term: Cholesterol controlled with medications as prescribed, with individualized exercise RX and with personalized nutrition plan. Value goals: LDL < 70mg44mL > 40 mg.    Personal Goal Other Yes    Personal Goal Reduce back pain. Increased mobility in the morning. Know how to prepare heart healthy meals.    Intervention Develop individualized exercise routine including aerobic training, resistance training, and stretching exercises to help increase strength and mobility. Nutrition counseling on how to prepare heart healthy meals.    Expected Outcomes Patient will be build strength and improve mobility and be able to get around better in the mornings as measured by self-report. Patient will be able to prepare/ know how to prepare heart healthy meals.              Personal Goals Discharge:  Goals and Risk Factor Review     Row Name 10/03/21 1415 10/25/21 1610 11/22/21 1226         Core Components/Risk Factors/Patient Goals Review   Personal Goals Review Weight Management/Obesity;Hypertension;Lipids Weight Management/Obesity;Hypertension;Lipids Weight Management/Obesity;Hypertension;Lipids     Review Zachary Heath started  cardiac rehab on 10/03/21 and did well with exercise.Vital signs were stable Zachary Crumbbeen doing well with exercise at phase 2 cardiac rehab. Vital signs have been stable. Zachary Crumbbeen doing well with exercise at phase 2 cardiac rehab. Vital signs have been stable. Zachary Heath complete phase 2 cardiac rehab on 11/25/21.     Expected Outcomes Zachary Heath continue to participate in phase 2 cardiac rehab for exercise, nutrition and lifestyle modifications Zachary Heath continue to participate in phase 2 cardiac rehab for exercise, nutrition and lifestyle modifications Zachary Heath continue  exercise, follow  nutrition and lifestyle modifications upon completion of phase 2 cardiac rehab.              Exercise Goals and Review:  Exercise Goals     Row Name 09/27/21 1018             Exercise Goals   Increase Physical Activity Yes       Intervention Provide advice, education, support and counseling about physical activity/exercise needs.;Develop an individualized exercise prescription for aerobic and resistive training based on initial evaluation findings, risk stratification, comorbidities and participant's personal goals.       Expected Outcomes Short Term: Attend rehab on a regular basis to increase amount of physical activity.;Long Term: Exercising regularly at least 3-5 days a week.;Long Term: Add in home exercise to make exercise part of routine and to increase amount of physical activity.       Increase Strength and Stamina Yes       Intervention Provide advice, education, support and counseling about physical activity/exercise needs.;Develop an individualized  exercise prescription for aerobic and resistive training based on initial evaluation findings, risk stratification, comorbidities and participant's personal goals.       Expected Outcomes Short Term: Increase workloads from initial exercise prescription for resistance, speed, and METs.;Short Term: Perform resistance training exercises  routinely during rehab and add in resistance training at home;Long Term: Improve cardiorespiratory fitness, muscular endurance and strength as measured by increased METs and functional capacity (6MWT)       Able to understand and use rate of perceived exertion (RPE) scale Yes       Intervention Provide education and explanation on how to use RPE scale       Expected Outcomes Short Term: Able to use RPE daily in rehab to express subjective intensity level;Long Term:  Able to use RPE to guide intensity level when exercising independently       Knowledge and understanding of Target Heart Rate Range (THRR) Yes       Intervention Provide education and explanation of THRR including how the numbers were predicted and where they are located for reference       Expected Outcomes Short Term: Able to state/look up THRR;Short Term: Able to use daily as guideline for intensity in rehab;Long Term: Able to use THRR to govern intensity when exercising independently       Able to check pulse independently Yes       Intervention Provide education and demonstration on how to check pulse in carotid and radial arteries.;Review the importance of being able to check your own pulse for safety during independent exercise       Expected Outcomes Long Term: Able to check pulse independently and accurately;Short Term: Able to explain why pulse checking is important during independent exercise       Understanding of Exercise Prescription Yes       Intervention Provide education, explanation, and written materials on patient's individual exercise prescription       Expected Outcomes Short Term: Able to explain program exercise prescription;Long Term: Able to explain home exercise prescription to exercise independently                Exercise Goals Re-Evaluation:  Exercise Goals Re-Evaluation     Row Name 10/03/21 1131 10/17/21 1040 11/11/21 1057 11/21/21 1145 11/25/21 1148     Exercise Goal Re-Evaluation   Exercise  Goals Review Increase Physical Activity;Able to understand and use rate of perceived exertion (RPE) scale Increase Physical Activity;Able to understand and use rate of perceived exertion (RPE) scale;Increase Strength and Stamina;Knowledge and understanding of Target Heart Rate Range (THRR);Understanding of Exercise Prescription Increase Physical Activity;Able to understand and use rate of perceived exertion (RPE) scale;Increase Strength and Stamina;Knowledge and understanding of Target Heart Rate Range (THRR);Understanding of Exercise Prescription Increase Physical Activity;Able to understand and use rate of perceived exertion (RPE) scale;Increase Strength and Stamina;Knowledge and understanding of Target Heart Rate Range (THRR);Understanding of Exercise Prescription Increase Physical Activity;Able to understand and use rate of perceived exertion (RPE) scale;Increase Strength and Stamina;Knowledge and understanding of Target Heart Rate Range (THRR);Understanding of Exercise Prescription   Comments Patient able to understand and use RPE scale appropriately. Reviewed exercise prescription with patient. Patient is currently walking about 15 minutes when the weather permits as his mode of home exercise. Discussed increasing duration, and patient is amenable to this. Walking is limited by leg stiffness from Parkinson's and back pain. Patient jogged in the past and is interested in walking on the TM on some days at cardiac rehab. Patient  walks 3-5 minutes 1-2 times around church parking lot on days he attends cardiac rehab. Patient's walking is limited by chronic pain from Parkinson's disease, and he says when he walks on non-cardiac rehab days it effects what he's able to do at cardiac rehab. Patient plans to walk at least 3-4 days/week upon completion of the cardiac rehab program. Patient is stretching at home using the stretches given at cardiac rehab. Discussed getting up and walking, marching, or doing a seated  march during commerical breaks when he's sitting watching TV to help to keep from getting stiff, and patient is amenable to this. Patient enjoys dancing, which I told him can be a form of aerobic exercise. Because of limitations with walking, patient did not use TM at cardiac rehab. Patient scheduled to complete the cardiac rehab program this week. Functional capacity increased 3% as measured by 6MWT, strength increased 12% as measured by grip strength test. Patient feels the program has been beneficial for him and his mobility is better. Patient still has leg pain after walking, which is chronic for him . Patient plans to walk at least 3 days/week. Because of Parkinsons and chronic pain, patient will do short bouts of walking as tolerated. Patient also stretches, walks in place indoors, and does resistance exercises like modified push-ups. Patient completed the cardiac rehab program and made gradual progress with exercise, achieving 3.0 METs with exercise. Exercise limited by chronic pain.  Patient plans to walk as tolerated and continue daily stretching and calisthenics.   Expected Outcomes Increase workloads as tolerated to help increase strength and stamina. Patient will increase walking at home with the goal of achieving 150 minutes of aerobic exercise/week. Patient will try walking on TM at CR. Patient will walk 3-4 days/week upon completion of the CR program to help maintain health and fitness benefits. Patient will walk at least 3 days/week as tolerated. Patient will walk at least 3 days/week as tolerated, continue daily stretching and calisthenics to help maintain mobility.            Nutrition & Weight - Outcomes:  Pre Biometrics - 09/27/21 1017       Pre Biometrics   Waist Circumference 34.5 inches    Hip Circumference 39.25 inches    Waist to Hip Ratio 0.88 %    Triceps Skinfold 18 mm    % Body Fat 26.1 %    Grip Strength 42 kg    Flexibility --   Not performed, chronic back pain    Single Leg Stand 8.31 seconds             Post Biometrics - 11/25/21 1015        Post  Biometrics   Height 5' 5.25" (1.657 m)    Waist Circumference 33.5 inches    Hip Circumference 38.75 inches    Waist to Hip Ratio 0.86 %    BMI (Calculated) 27.13    Triceps Skinfold 14 mm    % Body Fat 24.6 %    Grip Strength 47 kg    Flexibility --   Not performed, chronic back pain   Single Leg Stand 3.5 seconds             Nutrition:  Nutrition Therapy & Goals - 11/25/21 1122       Nutrition Therapy   Diet Heart Healthy    Drug/Food Interactions Statins/Certain Fruits      Personal Nutrition Goals   Nutrition Goal Patient will eat a variety of fruits, vegetables,  wholes grains, lean proteins/plant based protein sources, nuts/seeds, low fat dairy at meals    Personal Goal #2 Patient will increase fiber intake daily- aim for 4-5 fruit/vegetables servings per day.    Personal Goal #3 Patient will decrease sodium intake to <2330m per day.    Comments Patient has started increasing fruit intake. Working with his daughter on incorporating more fruits and vegetables; they also purchased a blender and will start making smoothies. He is limited with cooking/meal prep due to Parkinson's disease; he often chooses easy to prepare items or eats what his daughter cooks related to disease limitations.      Intervention Plan   Intervention Prescribe, educate and counsel regarding individualized specific dietary modifications aiming towards targeted core components such as weight, hypertension, lipid management, diabetes, heart failure and other comorbidities.;Nutrition handout(s) given to patient.    Expected Outcomes Long Term Goal: Adherence to prescribed nutrition plan.             Nutrition Discharge:  Nutrition Assessments - 11/14/21 1506       Rate Your Plate Scores   Post Score 69             Education Questionnaire Score:  Knowledge Questionnaire Score - 11/11/21 1655        Knowledge Questionnaire Score   Pre Score 19/24    Post Score 21/24             Goals reviewed with patient; copy given to patient.Zachary Heath graduated from cardiac rehab program on 11/25/21  with completion of 24  exercise sessions in Phase II. Pt maintained good attendance and progressed nicely during his participation in rehab as evidenced by increased MET level.   Medication list reconciled. Repeat  PHQ score- 0 .  Pt has made significant lifestyle changes and should be commended for his success. Pt feels he has achieved his goals during cardiac rehab.   Pt plans to continue exercise by walking and stretching at home. Zachary Heath the program was helpful for him and he feels stronger. We are proud of Zachary Heath's progress! MHarrell GaveRN BSN

## 2021-12-27 ENCOUNTER — Other Ambulatory Visit: Payer: Self-pay

## 2021-12-27 ENCOUNTER — Emergency Department (HOSPITAL_BASED_OUTPATIENT_CLINIC_OR_DEPARTMENT_OTHER)
Admission: EM | Admit: 2021-12-27 | Discharge: 2021-12-27 | Disposition: A | Discharge status: Home / Self Care | Payer: No Typology Code available for payment source | Attending: Emergency Medicine | Admitting: Emergency Medicine

## 2021-12-27 ENCOUNTER — Encounter (HOSPITAL_BASED_OUTPATIENT_CLINIC_OR_DEPARTMENT_OTHER): Payer: Self-pay | Admitting: *Deleted

## 2021-12-27 DIAGNOSIS — L7632 Postprocedural hematoma of skin and subcutaneous tissue following other procedure: Secondary | ICD-10-CM | POA: Diagnosis not present

## 2021-12-27 DIAGNOSIS — Z7982 Long term (current) use of aspirin: Secondary | ICD-10-CM | POA: Insufficient documentation

## 2021-12-27 NOTE — ED Triage Notes (Signed)
Pt had a brow lift yesterday and is here today due to swelling making it impossible for him to open his eye.  Pt has 2 incisions on his forehead with steri strips in place.  Swelling and bruising to forehead and both eyes.  Pt is hypertensive in triage.  Family states that he has not taken his BP meds and pt is anxious about eye swelling

## 2021-12-27 NOTE — ED Notes (Signed)
Ice pack given and encouraged him to ice his eyes to reduce swelling

## 2021-12-27 NOTE — ED Notes (Signed)
Pt verbalizes understanding of discharge instructions. Opportunity for questioning and answers were provided. Pt discharged from ED to home.   ? ?

## 2021-12-27 NOTE — ED Provider Notes (Signed)
[facility]  Provider Note  CSN: [id number] Arrival date & time: 12/27/21 0245  History Chief Complaint  Patient presents with  . Post-op Problem    Zachary Heath is a 76 y.o. male had a brow lift done by plastic surgery at the [facility] clinic yesterday (to help with his vision due to drooping eye lids). He reports he has had increased swelling and bruising to his periorbital areas and is unable to open his eyes now. He was not aware that this might happen although he does have some post-op instructions advising that bruising and swelling are to be expected and to use ice and sleep sitting up to help with those problems. He is not taking a blood thinner.    Home Medications Prior to Admission medications   Medication Sig Start Date End Date Taking? Authorizing Provider  aspirin EC 81 MG tablet Take 162 mg by mouth daily. Swallow whole.    [provider]  Carbidopa 25 MG tablet Take 25 mg by mouth 4 (four) times daily.    [provider]  carbidopa-levodopa (SINEMET CR) 50-200 MG tablet Take 1 tablet by mouth at bedtime.    [provider]  carbidopa-levodopa (SINEMET IR) 25-100 MG tablet Take 1 tablet by mouth 4 (four) times daily.  11/19/18   [provider]  Carboxymethylcellul-Glycerin (LUBRICATING EYE DROPS OP) Place 1 drop into both eyes 2 (two) times daily.    [provider]  cetirizine (ZYRTEC) 10 MG tablet Take 10 mg by mouth daily. Patient not taking: Reported on 11/25/2021    [provider]  Cholecalciferol (VITAMIN D) 50 MCG (2000 UT) tablet Take 2,000 Units by mouth daily. Patient not taking: Reported on 11/25/2021    [provider]  ferrous sulfate 325 (65 FE) MG tablet Take 325 mg by mouth daily.    [provider]  folic acid (FOLVITE) A999333 MCG tablet Take 400 mcg by mouth daily.    [provider]  furosemide (LASIX) 40 MG tablet Take 40 mg by mouth every other  day. Patient not taking: Reported on 11/25/2021    [provider]  magnesium oxide (MAG-OX) 400 MG tablet Take 400 mg by mouth daily.    [provider]  metoprolol tartrate (LOPRESSOR) 50 MG tablet Take 50 mg by mouth 2 (two) times daily.    [provider]  pantoprazole (PROTONIX) 40 MG tablet Take 40 mg by mouth 2 (two) times daily. 03/25/19   [provider]  rasagiline (AZILECT) 1 MG TABS tablet Take 1 mg by mouth daily. 04/09/19   [provider]  rOPINIRole (REQUIP) 1 MG tablet Take 1 mg by mouth in the morning, at noon, in the evening, and at bedtime.    [provider]  rosuvastatin (CRESTOR) 40 MG tablet Take 40 mg by mouth daily.    [provider]  zinc gluconate 50 MG tablet Take 50 mg by mouth daily.    [provider]  amLODipine (NORVASC) 5 MG tablet Take 1 tablet (5 mg total) by mouth daily. 04/24/19 04/24/19  Gareth Morgan, MD     Allergies    Penicillin g   Review of Systems   Review of Systems Please see HPI for pertinent positives and negatives  Physical Exam BP (!) 199/82 (BP Location: Left Arm)   Pulse 79   Temp 98.7 F (37.1 C) (Oral)   Resp 16   SpO2 98%   Physical Exam Vitals and  nursing note reviewed.  Constitutional:      Appearance: Normal appearance.  HENT:     Head: Normocephalic and atraumatic.     Comments: Surgical incisions to both sides of forehead and CDI with steristrips, no fluid collection or bleeding    Nose: Nose normal.     Mouth/Throat:     Mouth: Mucous membranes are moist.  Eyes:     Comments: Marked periorbital edema and ecchymosis bilaterally, unable to visualize the globe, but patient is able to see a light shined through the small slit opening when eyelids retracted  Cardiovascular:     Rate and Rhythm: Normal rate.  Pulmonary:     Effort: Pulmonary effort is normal.     Breath sounds: Normal breath sounds.  Abdominal:     General: Abdomen is flat.      Palpations: Abdomen is soft.     Tenderness: There is no abdominal tenderness.  Musculoskeletal:        General: No swelling. Normal range of motion.     Cervical back: Neck supple.  Skin:    General: Skin is warm and dry.  Neurological:     General: No focal deficit present.     Mental Status: He is alert.  Psychiatric:        Mood and Affect: Mood normal.     ED Results / Procedures / Treatments   EKG None  Procedures Procedures  Medications Ordered in the ED Medications - No data to display  Initial Impression and Plan  I have discussed with the patient and family that I have no experience with this particular surgery and no available plastic surgeons to consult. Regardless, I do not seen any life or vision threatening complications at this point. I recommend he continue with elevated head and ice packs and to call his surgeon in the morning to discuss his concerns.   ED Course   Clinical Course as of 12/27/21 0441  Tue Dec 27, 2021  0440 Patient's BP is noted to be elevated, but he has not had his BP meds, is anxious and does not have any symptoms concerning for end organ damage.  [CS]    Clinical Course User Index [CS] Truddie Hidden, MD     MDM Rules/Calculators/A&P Medical Decision Making Problems Addressed: Postoperative hematoma of skin following non-dermatologic procedure: acute illness or injury    Final Clinical Impression(s) / ED Diagnoses Final diagnoses:  Postoperative hematoma of skin following non-dermatologic procedure    Rx / DC Orders ED Discharge Orders     None        Truddie Hidden, MD 12/27/21 [phone]

## 2023-03-27 IMAGING — CR DG CHEST 2V
2 series · 2 of 2 positions shown · non-contrast
Comparison: Esophagram 12/14/2017

CLINICAL DATA: Sudden onset shortness of breath, history of heart
surgery

EXAM:
CHEST - 2 VIEW

[w chest pa]
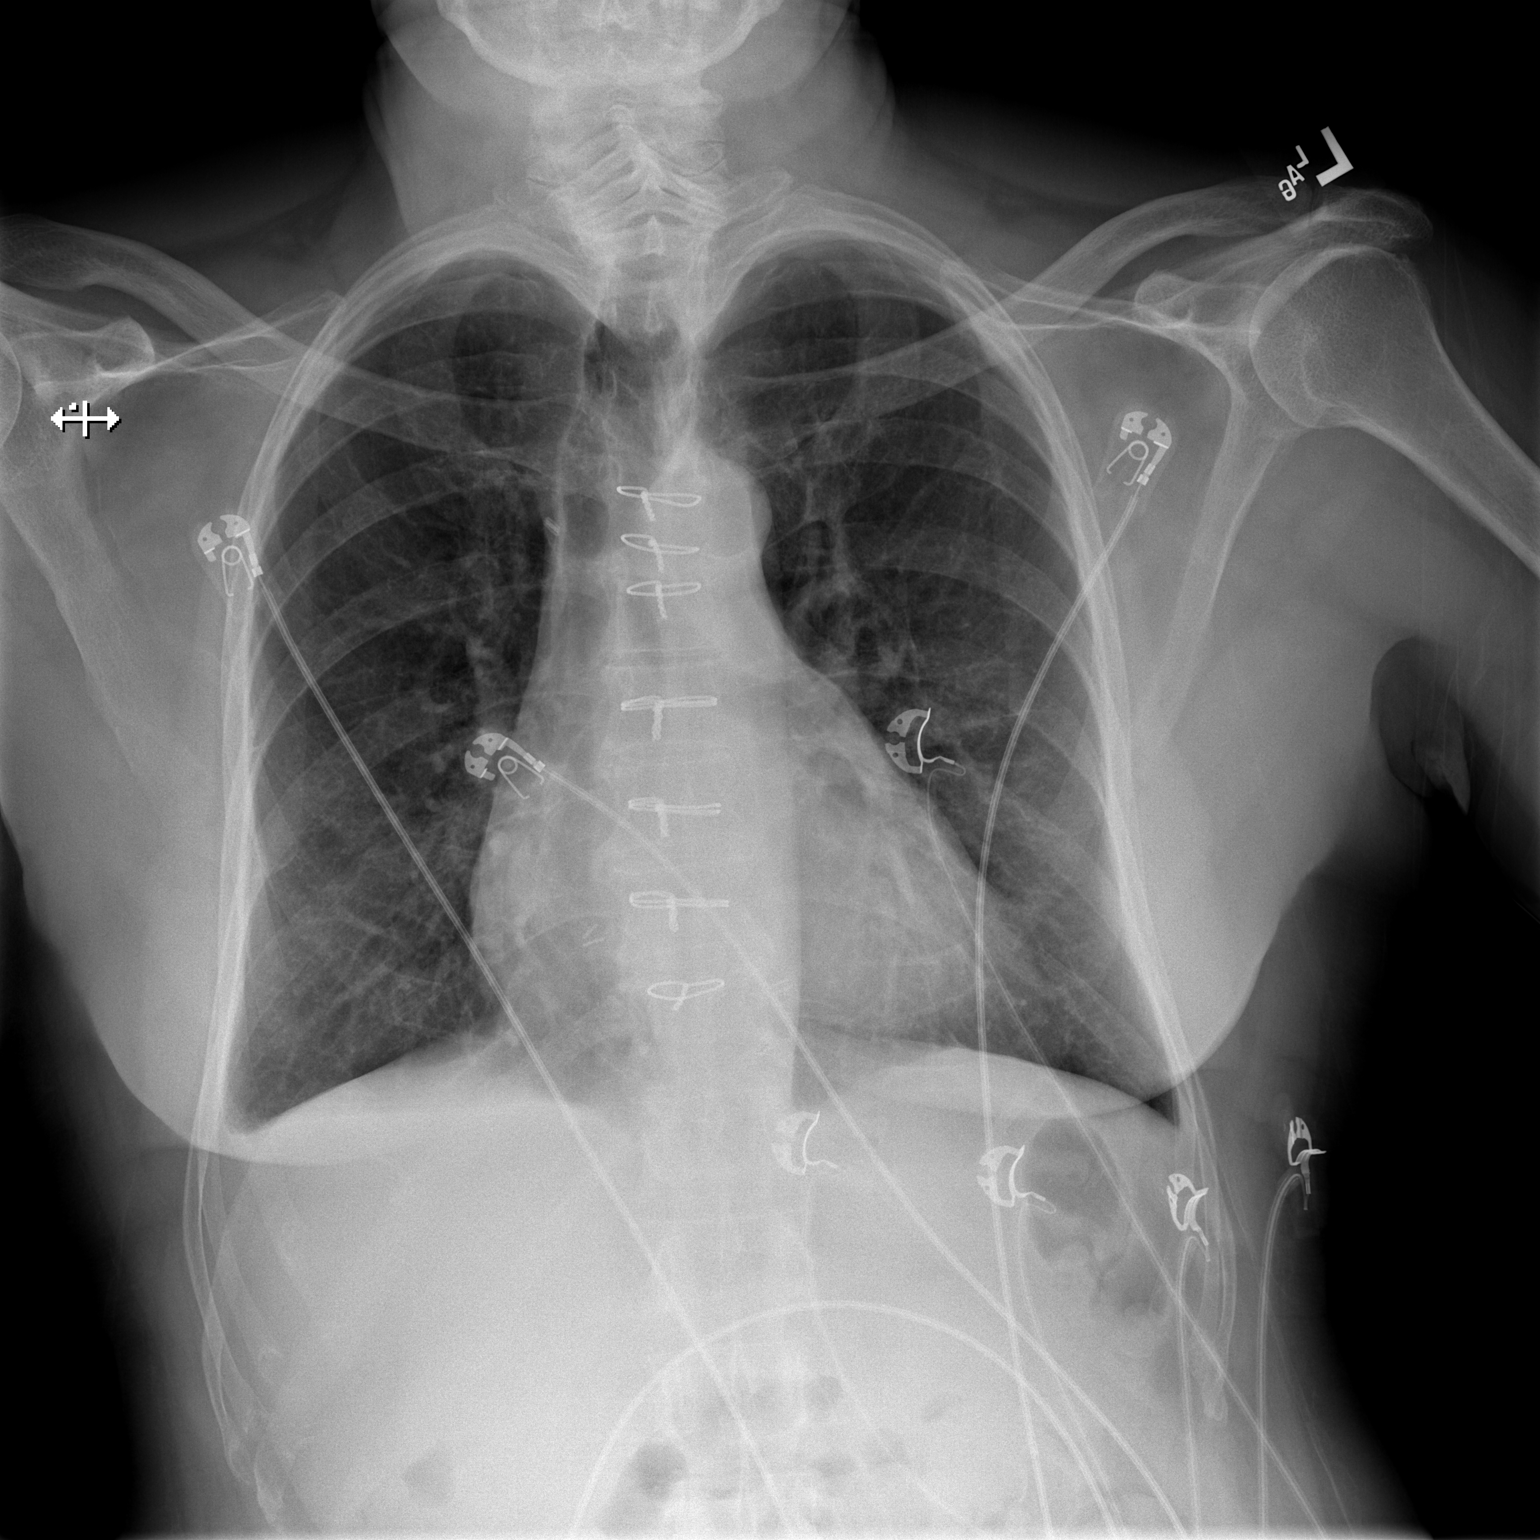

[w chest lat]
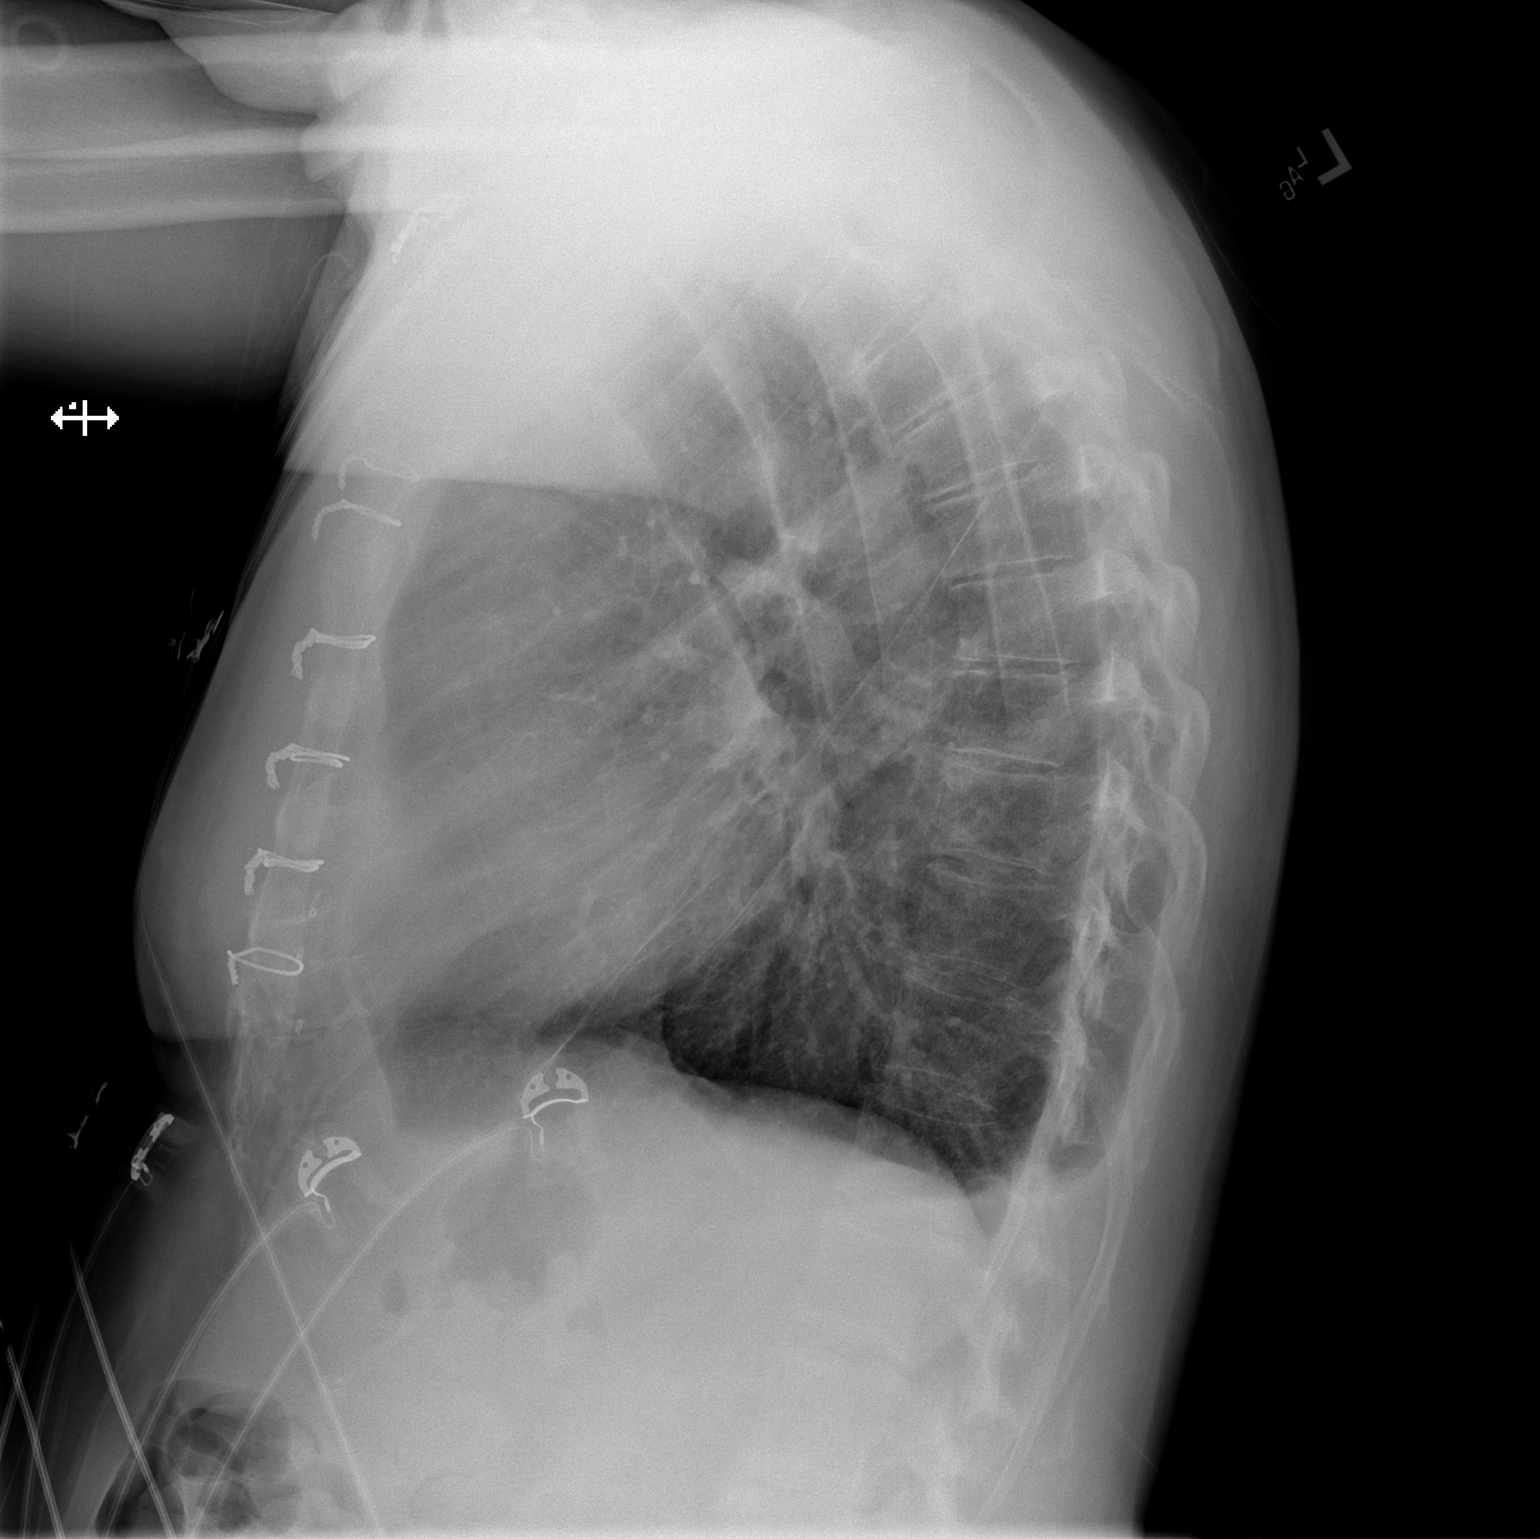

[2 of 2 positions shown; findings below may reference images not displayed]

FINDINGS: Blunting of right costophrenic sulcus may reflect a small effusion.
Mild pulmonary vascular congestion without features of frank edema.
Postsurgical changes from prior sternotomy and CABG. The aorta is
calcified. The remaining cardiomediastinal contours are
unremarkable. Telemetry leads overlie the chest. No acute osseous or
soft tissue abnormality.
IMPRESSION: Blunting of the right costophrenic sulcus could reflect small
effusion or scarring.

Mild pulmonary vascular congestion without evidence of frank edema.

Prior sternotomy, CABG.

Aortic Atherosclerosis ([id number]).

## 2024-01-23 ENCOUNTER — Ambulatory Visit: Admitting: Diagnostic Neuroimaging

## 2024-08-10 ENCOUNTER — Inpatient Hospital Stay (HOSPITAL_COMMUNITY): Admission: EM | Admit: 2024-08-10 | Source: Home / Self Care

## 2024-08-10 ENCOUNTER — Emergency Department (HOSPITAL_COMMUNITY)

## 2024-08-10 ENCOUNTER — Other Ambulatory Visit: Payer: Self-pay

## 2024-08-10 ENCOUNTER — Encounter (HOSPITAL_COMMUNITY): Payer: Self-pay

## 2024-08-10 DIAGNOSIS — R7401 Elevation of levels of liver transaminase levels: Secondary | ICD-10-CM

## 2024-08-10 DIAGNOSIS — J9601 Acute respiratory failure with hypoxia: Secondary | ICD-10-CM | POA: Diagnosis present

## 2024-08-10 DIAGNOSIS — I129 Hypertensive chronic kidney disease with stage 1 through stage 4 chronic kidney disease, or unspecified chronic kidney disease: Secondary | ICD-10-CM

## 2024-08-10 DIAGNOSIS — N1832 Chronic kidney disease, stage 3b: Secondary | ICD-10-CM | POA: Diagnosis not present

## 2024-08-10 DIAGNOSIS — E039 Hypothyroidism, unspecified: Secondary | ICD-10-CM

## 2024-08-10 DIAGNOSIS — R6521 Severe sepsis with septic shock: Secondary | ICD-10-CM

## 2024-08-10 DIAGNOSIS — G20A1 Parkinson's disease without dyskinesia, without mention of fluctuations: Secondary | ICD-10-CM

## 2024-08-10 DIAGNOSIS — A419 Sepsis, unspecified organism: Secondary | ICD-10-CM

## 2024-08-10 DIAGNOSIS — E440 Moderate protein-calorie malnutrition: Secondary | ICD-10-CM | POA: Insufficient documentation

## 2024-08-10 DIAGNOSIS — M6282 Rhabdomyolysis: Secondary | ICD-10-CM

## 2024-08-10 DIAGNOSIS — J690 Pneumonitis due to inhalation of food and vomit: Secondary | ICD-10-CM

## 2024-08-10 DIAGNOSIS — S0003XA Contusion of scalp, initial encounter: Secondary | ICD-10-CM

## 2024-08-10 DIAGNOSIS — I2510 Atherosclerotic heart disease of native coronary artery without angina pectoris: Secondary | ICD-10-CM | POA: Diagnosis not present

## 2024-08-10 DIAGNOSIS — E785 Hyperlipidemia, unspecified: Secondary | ICD-10-CM | POA: Diagnosis not present

## 2024-08-10 DIAGNOSIS — J101 Influenza due to other identified influenza virus with other respiratory manifestations: Secondary | ICD-10-CM

## 2024-08-10 DIAGNOSIS — T796XXA Traumatic ischemia of muscle, initial encounter: Secondary | ICD-10-CM

## 2024-08-10 DIAGNOSIS — S0990XA Unspecified injury of head, initial encounter: Secondary | ICD-10-CM

## 2024-08-10 DIAGNOSIS — K219 Gastro-esophageal reflux disease without esophagitis: Secondary | ICD-10-CM | POA: Diagnosis not present

## 2024-08-10 DIAGNOSIS — N179 Acute kidney failure, unspecified: Secondary | ICD-10-CM

## 2024-08-10 DIAGNOSIS — R7989 Other specified abnormal findings of blood chemistry: Secondary | ICD-10-CM

## 2024-08-10 DIAGNOSIS — J189 Pneumonia, unspecified organism: Secondary | ICD-10-CM

## 2024-08-10 DIAGNOSIS — W19XXXA Unspecified fall, initial encounter: Principal | ICD-10-CM

## 2024-08-10 DIAGNOSIS — R1310 Dysphagia, unspecified: Secondary | ICD-10-CM

## 2024-08-10 LAB — CBC WITH DIFFERENTIAL/PLATELET
Abs Immature Granulocytes: 0.02 10*3/uL (ref 0.00–0.07)
Basophils Absolute: 0 10*3/uL (ref 0.0–0.1)
Basophils Relative: 0 %
Eosinophils Absolute: 0 10*3/uL (ref 0.0–0.5)
Eosinophils Relative: 0 %
HCT: 45.9 % (ref 39.0–52.0)
Hemoglobin: 14.9 g/dL (ref 13.0–17.0)
Immature Granulocytes: 0 %
Lymphocytes Relative: 5 %
Lymphs Abs: 0.4 10*3/uL — ABNORMAL LOW (ref 0.7–4.0)
MCH: 29.2 pg (ref 26.0–34.0)
MCHC: 32.5 g/dL (ref 30.0–36.0)
MCV: 90 fL (ref 80.0–100.0)
Monocytes Absolute: 0.3 10*3/uL (ref 0.1–1.0)
Monocytes Relative: 4 %
Neutro Abs: 7.4 10*3/uL (ref 1.7–7.7)
Neutrophils Relative %: 91 %
Platelets: 157 10*3/uL (ref 150–400)
RBC: 5.1 MIL/uL (ref 4.22–5.81)
RDW: 13.9 % (ref 11.5–15.5)
WBC: 8.2 10*3/uL (ref 4.0–10.5)
nRBC: 0 % (ref 0.0–0.2)

## 2024-08-10 LAB — COMPREHENSIVE METABOLIC PANEL WITH GFR
ALT: 16 U/L (ref 0–44)
AST: 134 U/L — ABNORMAL HIGH (ref 15–41)
Albumin: 3.8 g/dL (ref 3.5–5.0)
Alkaline Phosphatase: 60 U/L (ref 38–126)
Anion gap: 12 (ref 5–15)
BUN: 22 mg/dL (ref 8–23)
CO2: 23 mmol/L (ref 22–32)
Calcium: 9.2 mg/dL (ref 8.9–10.3)
Chloride: 100 mmol/L (ref 98–111)
Creatinine, Ser: 1.61 mg/dL — ABNORMAL HIGH (ref 0.61–1.24)
GFR, Estimated: 43 mL/min — ABNORMAL LOW
Glucose, Bld: 135 mg/dL — ABNORMAL HIGH (ref 70–99)
Potassium: 4.3 mmol/L (ref 3.5–5.1)
Sodium: 135 mmol/L (ref 135–145)
Total Bilirubin: 0.3 mg/dL (ref 0.0–1.2)
Total Protein: 6.8 g/dL (ref 6.5–8.1)

## 2024-08-10 LAB — URINALYSIS, W/ REFLEX TO CULTURE (INFECTION SUSPECTED)
Bacteria, UA: NONE SEEN
Bilirubin Urine: NEGATIVE
Glucose, UA: >=500 mg/dL — AB
Ketones, ur: 20 mg/dL — AB
Leukocytes,Ua: NEGATIVE
Nitrite: NEGATIVE
Protein, ur: 100 mg/dL — AB
Specific Gravity, Urine: 1.017 (ref 1.005–1.030)
pH: 6 (ref 5.0–8.0)

## 2024-08-10 LAB — TROPONIN T, HIGH SENSITIVITY
Troponin T High Sensitivity: 111 ng/L (ref 0–19)
Troponin T High Sensitivity: 117 ng/L (ref 0–19)

## 2024-08-10 LAB — PRO BRAIN NATRIURETIC PEPTIDE: Pro Brain Natriuretic Peptide: 2711 pg/mL — ABNORMAL HIGH

## 2024-08-10 LAB — RESP PANEL BY RT-PCR (RSV, FLU A&B, COVID)  RVPGX2
Influenza A by PCR: POSITIVE — AB
Influenza B by PCR: NEGATIVE
Resp Syncytial Virus by PCR: NEGATIVE
SARS Coronavirus 2 by RT PCR: NEGATIVE

## 2024-08-10 LAB — CK: Total CK: 4670 U/L — ABNORMAL HIGH (ref 49–397)

## 2024-08-10 LAB — I-STAT CG4 LACTIC ACID, ED: Lactic Acid, Venous: 1 mmol/L (ref 0.5–1.9)

## 2024-08-10 LAB — PROCALCITONIN: Procalcitonin: 0.32 ng/mL

## 2024-08-10 MED ORDER — ASPIRIN 81 MG PO TBEC
81.0000 mg | DELAYED_RELEASE_TABLET | Freq: Every day | ORAL | Status: DC
  Administered 2024-08-10 – 2024-08-12 (×3): 81 mg via ORAL
  Filled 2024-08-10 (×3): qty 1

## 2024-08-10 MED ORDER — OSELTAMIVIR PHOSPHATE 75 MG PO CAPS
75.0000 mg | ORAL_CAPSULE | Freq: Once | ORAL | Status: AC
  Administered 2024-08-10: 75 mg via ORAL
  Filled 2024-08-10: qty 1

## 2024-08-10 MED ORDER — ROPINIROLE HCL 1 MG PO TABS
1.0000 mg | ORAL_TABLET | Freq: Four times a day (QID) | ORAL | Status: DC
  Administered 2024-08-10 – 2024-08-12 (×6): 1 mg via ORAL
  Filled 2024-08-10 (×6): qty 1

## 2024-08-10 MED ORDER — SODIUM CHLORIDE 0.9 % IV SOLN
100.0000 mg | Freq: Once | INTRAVENOUS | Status: AC
  Administered 2024-08-10: 100 mg via INTRAVENOUS
  Filled 2024-08-10: qty 100

## 2024-08-10 MED ORDER — LACTATED RINGERS IV BOLUS
1000.0000 mL | Freq: Once | INTRAVENOUS | Status: AC
  Administered 2024-08-10: 1000 mL via INTRAVENOUS

## 2024-08-10 MED ORDER — SODIUM CHLORIDE 0.9 % IV SOLN: 500.0000 mg | Freq: Once | INTRAVENOUS | Status: DC

## 2024-08-10 MED ORDER — OSELTAMIVIR PHOSPHATE 30 MG PO CAPS
30.0000 mg | ORAL_CAPSULE | Freq: Two times a day (BID) | ORAL | Status: DC
  Administered 2024-08-11 – 2024-08-12 (×3): 30 mg via ORAL
  Filled 2024-08-10 (×5): qty 1

## 2024-08-10 MED ORDER — ACETAMINOPHEN 650 MG RE SUPP
650.0000 mg | Freq: Four times a day (QID) | RECTAL | Status: DC | PRN
  Administered 2024-08-11 – 2024-08-12 (×3): 650 mg via RECTAL
  Filled 2024-08-10 (×3): qty 1

## 2024-08-10 MED ORDER — POLYETHYLENE GLYCOL 3350 17 G PO PACK
17.0000 g | PACK | Freq: Every day | ORAL | Status: DC
  Filled 2024-08-10: qty 1

## 2024-08-10 MED ORDER — POLYETHYLENE GLYCOL 3350 17 G PO PACK: 17.0000 g | PACK | Freq: Every day | ORAL | Status: DC | PRN

## 2024-08-10 MED ORDER — LACTATED RINGERS IV SOLN: INTRAVENOUS | Status: DC

## 2024-08-10 MED ORDER — PANTOPRAZOLE SODIUM 40 MG PO TBEC
40.0000 mg | DELAYED_RELEASE_TABLET | Freq: Two times a day (BID) | ORAL | Status: DC
  Administered 2024-08-10: 40 mg via ORAL
  Filled 2024-08-10: qty 1

## 2024-08-10 MED ORDER — ENOXAPARIN SODIUM 40 MG/0.4ML IJ SOSY
40.0000 mg | PREFILLED_SYRINGE | INTRAMUSCULAR | Status: DC
  Administered 2024-08-10 – 2024-08-13 (×4): 40 mg via SUBCUTANEOUS
  Filled 2024-08-10 (×4): qty 0.4

## 2024-08-10 MED ORDER — LACTATED RINGERS IV BOLUS: 1000.0000 mL | Freq: Once | INTRAVENOUS | Status: DC

## 2024-08-10 MED ORDER — GUAIFENESIN ER 600 MG PO TB12
600.0000 mg | ORAL_TABLET | Freq: Two times a day (BID) | ORAL | Status: DC
  Administered 2024-08-10 – 2024-08-12 (×4): 600 mg via ORAL
  Filled 2024-08-10 (×5): qty 1

## 2024-08-10 MED ORDER — SODIUM CHLORIDE 0.9 % IV SOLN
2.0000 g | Freq: Once | INTRAVENOUS | Status: AC
  Administered 2024-08-10: 2 g via INTRAVENOUS
  Filled 2024-08-10: qty 20

## 2024-08-10 MED ORDER — RASAGILINE MESYLATE 1 MG PO TABS
1.0000 mg | ORAL_TABLET | Freq: Every day | ORAL | Status: DC
  Administered 2024-08-11 – 2024-08-12 (×2): 1 mg via ORAL
  Filled 2024-08-10 (×4): qty 1

## 2024-08-10 MED ORDER — ACETAMINOPHEN 325 MG PO TABS
650.0000 mg | ORAL_TABLET | Freq: Four times a day (QID) | ORAL | Status: DC | PRN
  Filled 2024-08-10: qty 2

## 2024-08-10 MED ORDER — CARBIDOPA-LEVODOPA ER 50-200 MG PO TBCR
1.0000 | EXTENDED_RELEASE_TABLET | Freq: Every day | ORAL | Status: DC
  Administered 2024-08-11: 1 via ORAL
  Filled 2024-08-10 (×2): qty 1

## 2024-08-10 MED ORDER — CARBIDOPA-LEVODOPA 25-100 MG PO TABS
1.0000 | ORAL_TABLET | Freq: Four times a day (QID) | ORAL | Status: DC
  Administered 2024-08-10 – 2024-08-12 (×7): 1 via ORAL
  Filled 2024-08-10 (×7): qty 1

## 2024-08-10 MED ORDER — LEVOTHYROXINE SODIUM 25 MCG PO TABS: 25.0000 ug | ORAL_TABLET | Freq: Every day | ORAL | Status: DC

## 2024-08-10 NOTE — ED Notes (Signed)
 Patient transported to CT

## 2024-08-10 NOTE — ED Notes (Signed)
 Pt to CT

## 2024-08-10 NOTE — ED Provider Notes (Signed)
 " [place] EMERGENCY DEPARTMENT AT [facility] Provider Note   CSN: [id number] Arrival date & time: 08/10/24  1335     Patient presents with: [facility] Pa[facility] is a 79 y.o. male.   79 year old male history of Parkinson's disease, hypertension, and CAD status post CABG who presents to the emergency department after a fall.  At 11 PM last night the patient was walking and got weak and fell to the ground.  No chest pain. Did hit his head.  Says he was on the ground until this morning at 8 AM when family members found him.  Denies pain aside from his head.  Also has been having a cough that is productive over the past few days and increasing weakness.  Was noted be febrile and tachycardic for EMS.  Not on blood thinners.   Called daughter [name]. He does have mild parkinson's with occasional difficulty walking. Got sick yesterday and fell last night. Lives with his daughter.        Prior to Admission medications  Medication Sig Start Date End Date Taking? Authorizing Provider  aspirin  EC 81 MG tablet Take 162 mg by mouth daily. Swallow whole.    [provider]  Carbidopa  25 MG tablet Take 25 mg by mouth 4 (four) times daily.    [provider]  carbidopa -levodopa  (SINEMET  CR) 50-200 MG tablet Take 1 tablet by mouth at bedtime.    [provider]  carbidopa -levodopa  (SINEMET  IR) 25-100 MG tablet Take 1 tablet by mouth 4 (four) times daily.  11/19/18   [provider]  Carboxymethylcellul-Glycerin (LUBRICATING EYE DROPS OP) Place 1 drop into both eyes 2 (two) times daily.    [provider]  cetirizine (ZYRTEC) 10 MG tablet Take 10 mg by mouth daily. Patient not taking: Reported on 11/25/2021    [provider]  Cholecalciferol (VITAMIN D) 50 MCG (2000 UT) tablet Take 2,000 Units by mouth daily. Patient not taking: Reported on 11/25/2021    [provider]  ferrous sulfate 325 (65 FE) MG tablet Take 325 mg  by mouth daily.    [provider]  folic acid (FOLVITE) 400 MCG tablet Take 400 mcg by mouth daily.    [provider]  furosemide  (LASIX ) 40 MG tablet Take 40 mg by mouth every other day. Patient not taking: Reported on 11/25/2021    [provider]  magnesium oxide (MAG-OX) 400 MG tablet Take 400 mg by mouth daily.    [provider]  metoprolol  tartrate (LOPRESSOR ) 50 MG tablet Take 50 mg by mouth 2 (two) times daily.    [provider]  pantoprazole  (PROTONIX ) 40 MG tablet Take 40 mg by mouth 2 (two) times daily. 03/25/19   [provider]  rasagiline  (AZILECT ) 1 MG TABS tablet Take 1 mg by mouth daily. 04/09/19   [provider]  rOPINIRole  (REQUIP ) 1 MG tablet Take 1 mg by mouth in the morning, at noon, in the evening, and at bedtime.    [provider]  rosuvastatin (CRESTOR) 40 MG tablet Take 40 mg by mouth daily.    [provider]  zinc gluconate 50 MG tablet Take 50 mg by mouth daily.    [provider]  amLODipine  (NORVASC ) 5 MG tablet Take 1 tablet (5 mg total) by mouth daily. 04/24/19 04/24/19  Dreama Longs, MD    Allergies: Penicillin g    Review of Systems  Updated Vital Signs BP 139/66  Pulse 75   Temp 98 F (36.7 C)   Resp (!) 21   Ht 5' 7 (1.702 m)   Wt 68 kg   SpO2 100%   BMI 23.49 kg/m   Physical Exam Vitals and nursing note reviewed.  Constitutional:      General: He is not in acute distress.    Appearance: He is well-developed.  HENT:     Head: Normocephalic and atraumatic.     Right Ear: External ear normal.     Left Ear: External ear normal.     Nose: Nose normal.  Eyes:     Extraocular Movements: Extraocular movements intact.     Conjunctiva/sclera: Conjunctivae normal.     Pupils: Pupils are equal, round, and reactive to light.  Cardiovascular:     Rate and Rhythm: Normal rate and regular rhythm.     Heart sounds: Normal heart sounds.  Pulmonary:      Effort: Pulmonary effort is normal. No respiratory distress.     Breath sounds: Rhonchi present.  Abdominal:     General: There is no distension.     Palpations: Abdomen is soft. There is no mass.     Tenderness: There is no abdominal tenderness. There is no guarding.  Musculoskeletal:     Cervical back: Normal range of motion and neck supple.     Right lower leg: No edema.     Left lower leg: No edema.     Comments: No tenderness to palpation of bilateral shoulders, elbows, wrists, hips, knees, or ankles.  No cervical, thoracic, or lumbar spinal tenderness to palpation  Skin:    General: Skin is warm and dry.  Neurological:     Mental Status: He is alert and oriented to person, place, and time. Mental status is at baseline.     Cranial Nerves: No cranial nerve deficit.     Sensory: No sensory deficit.     Motor: No weakness.  Psychiatric:        Mood and Affect: Mood normal.        Behavior: Behavior normal.     (all labs ordered are listed, but only abnormal results are displayed) Labs Reviewed  RESP PANEL BY RT-PCR (RSV, FLU A&B, COVID)  RVPGX2 - Abnormal; Notable for the following components:      Result Value   Influenza A by PCR POSITIVE (*)    All other components within normal limits  COMPREHENSIVE METABOLIC PANEL WITH GFR - Abnormal; Notable for the following components:   Glucose, Bld 135 (*)    Creatinine, Ser 1.61 (*)    AST 134 (*)    GFR, Estimated 43 (*)    All other components within normal limits  CBC WITH DIFFERENTIAL/PLATELET - Abnormal; Notable for the following components:   Lymphs Abs 0.4 (*)    All other components within normal limits  PRO BRAIN NATRIURETIC PEPTIDE - Abnormal; Notable for the following components:   Pro Brain Natriuretic Peptide 2,711.0 (*)    All other components within normal limits  URINALYSIS, W/ REFLEX TO CULTURE (INFECTION SUSPECTED) - Abnormal; Notable for the following components:   Glucose, UA >=500 (*)    Hgb urine  dipstick LARGE (*)    Ketones, ur 20 (*)    Protein, ur 100 (*)    All other components within normal limits  CK - Abnormal; Notable for the following components:   Total CK 4,670 (*)    All other components within normal limits  TROPONIN T,  HIGH SENSITIVITY - Abnormal; Notable for the following components:   Troponin T High Sensitivity 111 (*)    All other components within normal limits  TROPONIN T, HIGH SENSITIVITY - Abnormal; Notable for the following components:   Troponin T High Sensitivity 117 (*)    All other components within normal limits  CULTURE, BLOOD (ROUTINE X 2)  CULTURE, BLOOD (ROUTINE X 2)  PROCALCITONIN  I-STAT CG4 LACTIC ACID, ED    EKG: EKG Interpretation Date/Time:  Sunday August 10 2024 14:06:26 EST Ventricular Rate:  82 PR Interval:  163 QRS Duration:  96 QT Interval:  336 QTC Calculation: 393 R Axis:   -1  Text Interpretation: Sinus rhythm Probable left atrial enlargement 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 Multiplanar reformatted images are provided for review. Automated exposure control, iterative reconstruction, and/or weight based adjustment of the mA/kV was utilized to reduce the radiation dose to as low as reasonably achievable. COMPARISON: None available. CLINICAL HISTORY: fall Fall. FINDINGS: BONES AND ALIGNMENT: No acute fracture or traumatic malalignment. DEGENERATIVE CHANGES: Disc space narrowing at multiple levels most pronounced at C3-C4. Small disc osteophyte complexes at multiple levels. No high grade osseous spinal canal stenosis. Facet arthrosis and uncovertebral hypertrophy at multiple levels. Foraminal stenosis is most  pronounced at C3-C4 and C6-C7. SOFT TISSUES: No prevertebral soft tissue swelling. Atherosclerosis at the carotid bifurcations. IMPRESSION: 1. No acute cervical spine fracture or traumatic malalignment. Electronically signed by: Donnice Mania MD 08/10/2024 04:22 PM EST RP Workstation: [id number]   CT Head Wo Contrast Result Date: 08/10/2024 EXAM: CT HEAD WITHOUT CONTRAST 08/10/2024 03:38:05 PM TECHNIQUE: CT of the head was performed without the administration of intravenous contrast. Automated exposure control, iterative reconstruction, and/or weight based adjustment of the mA/kV was utilized to reduce the radiation dose to as low as reasonably achievable. COMPARISON: 04/24/2019 CLINICAL HISTORY: Patient fell. FINDINGS: BRAIN AND VENTRICLES: No acute hemorrhage. No evidence of acute infarct. No hydrocephalus. No extra-axial collection. No mass effect or midline shift. Basal ganglia calcifications. Mild chronic microvascular ischemic changes. Left lens replacement. ORBITS: No acute abnormality. SINUSES: Scattered mucosal thickening in the ethmoid sinuses and partially visualized maxillary sinuses. SOFT TISSUES AND SKULL: Small right frontal scalp hematoma. There is additional mild soft tissue swelling in the left frontal scalp. No skull fracture. IMPRESSION: 1. No acute intracranial abnormality. 2. Small right frontal scalp hematoma and mild soft tissue swelling in the left frontal scalp. Electronically signed by: Donnice Mania MD 08/10/2024 04:18 PM EST RP Workstation: [id number]   DG Abdomen 1 View Result Date: 08/10/2024 EXAM: 1 VIEW XRAY OF THE ABDOMEN 08/10/2024 03:06:00 PM COMPARISON: Comparison with pelvis 08/10/2024. CLINICAL HISTORY: Abnormal pelvic x-ray. FINDINGS: LINES, TUBES AND DEVICES: Telemetry leads overlie the chest. BOWEL: Nonobstructive bowel gas pattern. Scattered gas and stool throughout the colon. Scattered gas-filled small bowel. No small or large bowel distention. Changes are likely due  to ileus. SOFT TISSUES: No abnormal calcifications. No radiopaque stones. BONES: Severe degenerative change of the lumbar spine. Sternotomy noted. No acute fracture. LUNG BASES: Lung bases are clear. IMPRESSION: 1. Nondilated gas filled bowel likely due to ileus. No small or large bowel distention. Electronically signed by: Elsie Gravely MD 08/10/2024 03:22 PM EST RP Workstation: [id number]  DG Pelvis Portable Result Date: 08/10/2024 EXAM: 1 or 2 VIEW(S) XRAY OF THE PELVIS 08/10/2024 02:02:00 PM COMPARISON: None available. CLINICAL HISTORY: Questionable sepsis. Evaluate for abnormality. FINDINGS: BONES AND JOINTS: No acute fracture. No malalignment. Degenerative changes of the visualized lower lumbar spine, including spondylosis and degenerative disc disease. Mild spurring of both acetabula. SOFT TISSUES: Borderline dilated loop of left abdominal small bowel at 2.9 cm diameter. IMPRESSION: 1. No acute osseous abnormality of the pelvis. 2. Borderline dilated left abdominal small-bowel loop measuring 2.9 cm, which can be seen with ileus or early obstruction; consider further evaluation with dedicated abdominal imaging if clinically indicated. 3. Lower lumbar spondylosis and degenerative disc disease. 4. Mild degenerative spurring of both acetabula. Electronically signed by: Ryan Salvage MD 08/10/2024 02:39 PM EST RP Workstation: [id number]   DG Chest Port 1 View Result Date: 08/10/2024 EXAM: 1 VIEW(S) XRAY OF THE CHEST 08/10/2024 02:02:00 PM COMPARISON: None available. CLINICAL HISTORY: Questionable sepsis. Evaluate for abnormality. FINDINGS: LUNGS AND PLEURA: Patchy airspace opacity in the right infrahilar region. The lungs are otherwise clear. No pleural effusion. No pneumothorax. HEART AND MEDIASTINUM: No acute abnormality of the cardiac and mediastinal silhouettes. BONES AND SOFT TISSUES: Prior median sternotomy. IMPRESSION: 1. Patchy right infrahilar airspace opacity, suspicious for pneumonia. 2.  Prior median sternotomy. Electronically signed by: Ryan Salvage MD 08/10/2024 02:38 PM EST RP Workstation: [id number]     Procedures   Medications Ordered in the ED  lactated ringers  infusion ( Intravenous New Bag/Given 08/10/24 1358)  cefTRIAXone  (ROCEPHIN ) 2 g in sodium chloride  0.9 % 100 mL IVPB (0 g Intravenous Stopped 08/10/24 1427)  doxycycline  (VIBRAMYCIN ) 100 mg in sodium chloride  0.9 % 250 mL IVPB (100 mg Intravenous New Bag/Given 08/10/24 1456)  lactated ringers  bolus 1,000 mL (1,000 mLs Intravenous New Bag/Given 08/10/24 1540)    Clinical Course as of 08/10/24 1659  Sun Aug 10, 2024  1356 Confirmed DNR/DNI with the patient's daughter [RP]  1445 Creatinine(!): 1.61 At baseline [RP]  1500 Influenza A By PCR(!): POSITIVE [RP]  1636 Dr Griselda from [facility]  [RP]    Clinical Course User Index [RP] Yolande Lamar BROCKS, MD                                 Medical Decision Making Amount and/or Complexity of Data Reviewed Labs: ordered. Decision-making details documented in ED Course. Radiology: ordered.  Risk Prescription drug management. Decision regarding hospitalization.   Joshuan Marcos Ruta is a 79 year old male history of Parkinson's disease, hypertension, and CAD status post CABG who presents to the emergency department after a fall.  Initial Ddx:  URI, pneumonia, MI, TBI, C-spine injury, rhabdomyolysis, UTI  MDM/Course:  Patient resents emergency department generalized weakness and fall.  This occurred in the setting of a respiratory illness.  Does have a hematoma on his forehead.  Otherwise no signs of trauma.  Appears to be mentating well with a nonfocal neuro exam.  No other injuries as a result of the fall appreciated.  Febrile and tachycardic on arrival so code sepsis initiated and started on antibiotics for suspected pneumonia.  CT of the head and C-spine without acute abnormality.  Had x-rays of the chest and pelvis that did not show acute abnormality aside  from possible bowel distention.  He is not complaining of any GI symptoms and does not have any distention on exam.  Dedicated abdominal x-ray shows possible ileus.  Do not feel that he  needs an NG tube at this point in time because he is not having nausea or vomiting. Will CTM.  His x-ray showed a possible right middle lobe infiltrate which could potentially be due to aspiration.  May benefit from SLP evaluation.  Also found to be influenza positive.  Procalcitonin equivocal so will continue antibiotics for now.  Labs also showed mild rhabdomyolysis with a CK of 4600.  Given IV fluids.  Upon re-evaluation remained stable but developed a 2 L oxygen requirement.  Urinalysis without evidence of UTI.  Of note, EKG showed some borderline ST elevations in V1 V2.  Troponin was sent which is elevated but flat at 111 and 117.  Suspect this is demand from his sepsis.  Admitted to [facility] for additional management.  Daughter updated  This patient presents to the ED for concern of complaints listed in HPI, this involves an extensive number of treatment options, and is a complaint that carries with it a high risk of complications and morbidity. Disposition including potential need for admission considered.   Dispo: Admit  I have reviewed the patients home medications and made adjustments as needed Additional history obtained from daughter Records reviewed Outpatient Clinic Notes The following labs were independently interpreted: Chemistry and show CKD I independently reviewed the following imaging with scope of interpretation limited to determining acute life threatening conditions related to emergency care: CT Head and agree with the radiologist interpretation with the following exceptions: none I personally reviewed and interpreted cardiac monitoring: normal sinus rhythm  I personally reviewed and interpreted the pt's EKG: see above for interpretation  Consults: [facility] Social Determinants of health:   Geriatric  Portions of this note were generated with Scientist, clinical (histocompatibility and immunogenetics). Dictation errors may occur despite best attempts at proofreading.     Final diagnoses:  Fall, initial encounter  Injury of head, initial encounter  Sepsis, due to unspecified organism, unspecified whether acute organ dysfunction present [facility])  Pneumonia of right middle lobe due to infectious organism  Traumatic rhabdomyolysis, initial encounter    ED Discharge Orders     None          Yolande Lamar BROCKS, MD 08/10/24 1700  "

## 2024-08-10 NOTE — ED Triage Notes (Signed)
 Pt to ED via [facility] from home c/o fall, apparently pt has felt weak over the past couple of days with flu like symptoms, pt fell yesterday at the home d/t weakness, unwitnessed, occurred at night, pt was down until this morning. On assessment pt is slow to respond, but alert. Hematoma noted to forehead and upper lip. Pt is hot to touch.   Tylenol  650 mg given by EMS.   Last VS 180/90, 116, 28rr, 95%4L, TEMP 1O2.2
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
## 2024-08-11 ENCOUNTER — Observation Stay (HOSPITAL_COMMUNITY)

## 2024-08-11 DIAGNOSIS — A419 Sepsis, unspecified organism: Secondary | ICD-10-CM | POA: Diagnosis not present

## 2024-08-11 DIAGNOSIS — S0990XA Unspecified injury of head, initial encounter: Secondary | ICD-10-CM

## 2024-08-11 DIAGNOSIS — J9601 Acute respiratory failure with hypoxia: Secondary | ICD-10-CM | POA: Diagnosis not present

## 2024-08-11 DIAGNOSIS — W19XXXA Unspecified fall, initial encounter: Secondary | ICD-10-CM

## 2024-08-11 DIAGNOSIS — G20C Parkinsonism, unspecified: Secondary | ICD-10-CM | POA: Diagnosis not present

## 2024-08-11 DIAGNOSIS — J189 Pneumonia, unspecified organism: Secondary | ICD-10-CM

## 2024-08-11 DIAGNOSIS — J09X1 Influenza due to identified novel influenza A virus with pneumonia: Secondary | ICD-10-CM

## 2024-08-11 DIAGNOSIS — R6521 Severe sepsis with septic shock: Secondary | ICD-10-CM | POA: Diagnosis not present

## 2024-08-11 DIAGNOSIS — N1832 Chronic kidney disease, stage 3b: Secondary | ICD-10-CM | POA: Diagnosis not present

## 2024-08-11 LAB — COMPREHENSIVE METABOLIC PANEL WITH GFR
ALT: 21 U/L (ref 0–44)
AST: 184 U/L — ABNORMAL HIGH (ref 15–41)
Albumin: 3.8 g/dL (ref 3.5–5.0)
Alkaline Phosphatase: 63 U/L (ref 38–126)
Anion gap: 16 — ABNORMAL HIGH (ref 5–15)
BUN: 18 mg/dL (ref 8–23)
CO2: 23 mmol/L (ref 22–32)
Calcium: 9 mg/dL (ref 8.9–10.3)
Chloride: 99 mmol/L (ref 98–111)
Creatinine, Ser: 1.42 mg/dL — ABNORMAL HIGH (ref 0.61–1.24)
GFR, Estimated: 50 mL/min — ABNORMAL LOW
Glucose, Bld: 83 mg/dL (ref 70–99)
Potassium: 4.5 mmol/L (ref 3.5–5.1)
Sodium: 138 mmol/L (ref 135–145)
Total Bilirubin: 0.4 mg/dL (ref 0.0–1.2)
Total Protein: 6.8 g/dL (ref 6.5–8.1)

## 2024-08-11 LAB — I-STAT VENOUS BLOOD GAS, ED
Acid-Base Excess: 0 mmol/L (ref 0.0–2.0)
Bicarbonate: 23.7 mmol/L (ref 20.0–28.0)
Calcium, Ion: 1.05 mmol/L — ABNORMAL LOW (ref 1.15–1.40)
HCT: 50 % (ref 39.0–52.0)
Hemoglobin: 17 g/dL (ref 13.0–17.0)
O2 Saturation: 89 %
Potassium: 4.5 mmol/L (ref 3.5–5.1)
Sodium: 135 mmol/L (ref 135–145)
TCO2: 25 mmol/L (ref 22–32)
pCO2, Ven: 34.8 mmHg — ABNORMAL LOW (ref 44–60)
pH, Ven: 7.442 — ABNORMAL HIGH (ref 7.25–7.43)
pO2, Ven: 54 mmHg — ABNORMAL HIGH (ref 32–45)

## 2024-08-11 LAB — CBC
HCT: 49.3 % (ref 39.0–52.0)
Hemoglobin: 15.9 g/dL (ref 13.0–17.0)
MCH: 29 pg (ref 26.0–34.0)
MCHC: 32.3 g/dL (ref 30.0–36.0)
MCV: 90 fL (ref 80.0–100.0)
Platelets: 149 10*3/uL — ABNORMAL LOW (ref 150–400)
RBC: 5.48 MIL/uL (ref 4.22–5.81)
RDW: 14 % (ref 11.5–15.5)
WBC: 11.1 10*3/uL — ABNORMAL HIGH (ref 4.0–10.5)
nRBC: 0 % (ref 0.0–0.2)

## 2024-08-11 LAB — CBG MONITORING, ED
Glucose-Capillary: 67 mg/dL — ABNORMAL LOW (ref 70–99)
Glucose-Capillary: 185 mg/dL — ABNORMAL HIGH (ref 70–99)
Glucose-Capillary: 112 mg/dL — ABNORMAL HIGH (ref 70–99)

## 2024-08-11 LAB — I-STAT CG4 LACTIC ACID, ED: Lactic Acid, Venous: 1.6 mmol/L (ref 0.5–1.9)

## 2024-08-11 MED ORDER — SODIUM CHLORIDE 0.9 % IV SOLN
100.0000 mg | Freq: Two times a day (BID) | INTRAVENOUS | Status: DC
  Administered 2024-08-11 – 2024-08-13 (×5): 100 mg via INTRAVENOUS
  Filled 2024-08-11 (×5): qty 100

## 2024-08-11 MED ORDER — LEVOTHYROXINE SODIUM 25 MCG PO TABS
25.0000 ug | ORAL_TABLET | Freq: Every day | ORAL | Status: DC
  Administered 2024-08-12: 25 ug via ORAL
  Filled 2024-08-11: qty 1

## 2024-08-11 MED ORDER — LABETALOL HCL 5 MG/ML IV SOLN
5.0000 mg | Freq: Once | INTRAVENOUS | Status: AC
  Administered 2024-08-11: 5 mg via INTRAVENOUS
  Filled 2024-08-11: qty 4

## 2024-08-11 MED ORDER — AMLODIPINE BESYLATE 5 MG PO TABS
5.0000 mg | ORAL_TABLET | Freq: Every day | ORAL | Status: DC
  Administered 2024-08-11 – 2024-08-12 (×2): 5 mg via ORAL
  Filled 2024-08-11 (×2): qty 1

## 2024-08-11 MED ORDER — PHENOL 1.4 % MT LIQD: 1.0000 | OROMUCOSAL | Status: AC | PRN

## 2024-08-11 MED ORDER — LEVOTHYROXINE SODIUM 100 MCG/5ML IV SOLN: 12.5000 ug | Freq: Every day | INTRAVENOUS | Status: DC

## 2024-08-11 MED ORDER — LEVOTHYROXINE SODIUM 100 MCG/5ML IV SOLN
12.5000 ug | Freq: Every day | INTRAVENOUS | Status: DC
  Filled 2024-08-11: qty 5

## 2024-08-11 MED ORDER — METOPROLOL TARTRATE 25 MG PO TABS
25.0000 mg | ORAL_TABLET | Freq: Two times a day (BID) | ORAL | Status: DC
  Administered 2024-08-11 – 2024-08-12 (×3): 25 mg via ORAL
  Filled 2024-08-11 (×3): qty 1

## 2024-08-11 MED ORDER — PANTOPRAZOLE SODIUM 40 MG IV SOLR
40.0000 mg | Freq: Two times a day (BID) | INTRAVENOUS | Status: DC
  Administered 2024-08-11 (×2): 40 mg via INTRAVENOUS
  Filled 2024-08-11 (×2): qty 10

## 2024-08-11 MED ORDER — METOPROLOL TARTRATE 5 MG/5ML IV SOLN
5.0000 mg | Freq: Once | INTRAVENOUS | Status: AC
  Administered 2024-08-11: 5 mg via INTRAVENOUS
  Filled 2024-08-11: qty 5

## 2024-08-11 MED ORDER — SODIUM CHLORIDE 0.9 % IV SOLN
2.0000 g | INTRAVENOUS | Status: DC
  Administered 2024-08-11 – 2024-08-12 (×2): 2 g via INTRAVENOUS
  Filled 2024-08-11 (×2): qty 20

## 2024-08-11 MED ORDER — LACTATED RINGERS IV BOLUS
1000.0000 mL | Freq: Once | INTRAVENOUS | Status: AC
  Administered 2024-08-11: 1000 mL via INTRAVENOUS

## 2024-08-11 MED ORDER — DEXTROSE 50 % IV SOLN
1.0000 | Freq: Once | INTRAVENOUS | Status: AC
  Administered 2024-08-11: 50 mL via INTRAVENOUS
  Filled 2024-08-11: qty 50

## 2024-08-11 NOTE — ED Notes (Signed)
 Pt given po meds with applesauce. Pt did okay. Slow to swallow. Pt had a harder time with the size of the mucinex  requiring several additional swallows of applesauce.

## 2024-08-11 NOTE — Evaluation (Addendum)
 Physical Therapy Evaluation Patient Details Name: Zachary Heath MRN: [id number] DOB: 10-27-1945 Today's Date: 08/11/2024  History of Present Illness  Patient is a 79 yo male presenting to the ED with falls, cough, and SOB on 08/10/24. Found to be Flu A positive. PMHx of Parkinson's disease, CAD s/p CABG (2022), CKD3b, hypothyroidism, asthma, HTN and GERD  Clinical Impression  Pt admitted with above diagnosis and presents to PT with functional limitations due to deficits listed below (See PT problem list). Pt needs skilled PT to maximize independence and safety. Currently pt very weak with poor activity tolerance. Hopefully medical improvement with lead to improved mobility and activity tolerance. Patient will benefit from continued inpatient follow up therapy, <3 hours/day.           If plan is discharge home, recommend the following: Two people to help with walking and/or transfers;A lot of help with bathing/dressing/bathroom;Assistance with cooking/housework;Assist for transportation;Help with stairs or ramp for entrance   Can travel by private vehicle   No    Equipment Recommendations None recommended by PT  Recommendations for Other Services       Functional Status Assessment Patient has had a recent decline in their functional status and demonstrates the ability to make significant improvements in function in a reasonable and predictable amount of time.     Precautions / Restrictions Restrictions Weight Bearing Restrictions Per Provider Order: No      Mobility  Bed Mobility Overal bed mobility: Needs Assistance Bed Mobility: Supine to Sit, Sit to Supine     Supine to sit: +2 for physical assistance, Mod assist, HOB elevated Sit to supine: +2 for physical assistance, Mod assist   General bed mobility comments: Assist to bring legs off, elevate trunk into sitting and bring hips to EOB. Assist to lower trunk and bring legs back up onto stretcher.     Transfers Overall transfer level: Needs assistance Equipment used: 2 person hand held assist Transfers: Sit to/from Stand Sit to Stand: +2 physical assistance, Min assist           General transfer comment: Assist to power up and stabilize    Ambulation/Gait               General Gait Details: Pt unable to take any side steps at edge of stretcher  Stairs            Wheelchair Mobility     Tilt Bed    Modified Rankin (Stroke Patients Only)       Balance Overall balance assessment: Needs assistance Sitting-balance support: No upper extremity supported, Feet supported Sitting balance-Leahy Scale: Fair     Standing balance support: Bilateral upper extremity supported, During functional activity Standing balance-Leahy Scale: Poor Standing balance comment: +2 min assist and UE support                             Pertinent Vitals/Pain Pain Assessment Pain Assessment: No/denies pain    Home Living Family/patient expects to be discharged to:: Private residence Living Arrangements: Children Available Help at Discharge: Available PRN/intermittently;Family Type of Home: House Home Access: Other (comment) (stair lift)         Home Equipment: Cane - single point;Rolling Walker (2 wheels);Wheelchair - Careers Adviser (comment);Shower seat (stair lift)      Prior Function Prior Level of Function : Independent/Modified Independent             Mobility Comments: No assistive device. ADLs  Comments: Mod I for ADL's     Extremity/Trunk Assessment   Upper Extremity Assessment Upper Extremity Assessment: Generalized weakness;Right hand dominant    Lower Extremity Assessment Lower Extremity Assessment: Defer to PT evaluation    Cervical / Trunk Assessment Cervical / Trunk Assessment: Kyphotic  Communication   Communication Communication: No apparent difficulties    Cognition Arousal: Alert Behavior During Therapy: WFL for tasks  assessed/performed   PT - Cognitive impairments: No family/caregiver present to determine baseline                         Following commands: Impaired Following commands impaired: Follows multi-step commands with increased time     Cueing Cueing Techniques: Verbal cues, Tactile cues     General Comments General comments (skin integrity, edema, etc.): HR 110-120's with activity. SpO2 98-100% on 3L O2.    Exercises     Assessment/Plan    PT Assessment Patient needs continued PT services  PT Problem List Decreased strength;Decreased activity tolerance;Decreased balance;Decreased mobility       PT Treatment Interventions DME instruction;Gait training;Functional mobility training;Therapeutic activities;Therapeutic exercise;Balance training;Patient/family education    PT Goals (Current goals can be found in the Care Plan section)  Acute Rehab PT Goals Patient Stated Goal: Agreed with work to get stronger PT Goal Formulation: With patient Time For Goal Achievement: 08/25/24 Potential to Achieve Goals: Fair    Frequency Min 2X/week     Co-evaluation PT/OT/SLP Co-Evaluation/Treatment: Yes Reason for Co-Treatment: For patient/therapist safety;Complexity of the patient's impairments (multi-system involvement) PT goals addressed during session: Mobility/safety with mobility;Balance OT goals addressed during session: ADL's and self-care;Strengthening/ROM       AM-PAC PT 6 Clicks Mobility  Outcome Measure Help needed turning from your back to your side while in a flat bed without using bedrails?: A Lot Help needed moving from lying on your back to sitting on the side of a flat bed without using bedrails?: Total Help needed moving to and from a bed to a chair (including a wheelchair)?: Total Help needed standing up from a chair using your arms (e.g., wheelchair or bedside chair)?: Total Help needed to walk in hospital room?: Total Help needed climbing 3-5 steps with  a railing? : Total 6 Click Score: 7    End of Session Equipment Utilized During Treatment: Oxygen Activity Tolerance: Patient limited by fatigue Patient left: in bed;with call bell/phone within reach   PT Visit Diagnosis: Other abnormalities of gait and mobility (R26.89);Muscle weakness (generalized) (M62.81);History of falling (Z91.81);Unsteadiness on feet (R26.81)    Time: 1100-1115 PT Time Calculation (min) (ACUTE ONLY): 15 min   Charges:   PT Evaluation $PT Eval Moderate Complexity: 1 Mod   PT General Charges $$ ACUTE PT VISIT: 1 Visit         [facility] PT Acute Rehabilitation Services Office [phone]   Rodgers ORN [facility] 08/11/2024, 1:15 PM

## 2024-08-11 NOTE — ED Notes (Signed)
 Respiratory at bedside for deep suction via nasal passage.

## 2024-08-11 NOTE — ED Notes (Signed)
 RT bedside to deep suction PT via nasal canula

## 2024-08-11 NOTE — Progress Notes (Signed)
 Helping ED RT- RN called for NTS.  PT NTS x 2 (second suction was per pt request).  SX for moderate yellow mixed w/ some bloody secretions.  Gentle sx technique was used.    S/p sx- some improvement in breath sounds.  Per pt, he states sx helped his breathing.    HR 101, RR 20-22, sat 100% on 3 lpm [place].  No distress currently noted.

## 2024-08-11 NOTE — ED Notes (Signed)
 PT given a cup OJ to boost CBG of 67.Will recheck in 15 min.

## 2024-08-11 NOTE — Progress Notes (Addendum)
 "  HD#0 SUBJECTIVE:  Patient Summary: Zachary Heath is a 79 y.o. male with a pertinent PMH of Parkinson's disease, CAD s/p CABG (2022), CKD3b, hypothyroidism, asthma, HTN and GERD, who presented with flu-like symptoms + fall and admitted for sepsis 2/2 PNA.   Overnight Events: Hypertensive up to 206/126, received IV pushes of metoprolol . Hypoglycemic to 67 and received an amp of D50.   Interim History: Patient reports that he's still having trouble swallowing. States that he was not short of breath much despite labored breathing. Denies any chest pain. He was tired this morning and wanted to continue sleeping during evaluation on rounds.  Came back to evaluate patient later this morning and he was feeling markedly improved compared to initial evaluation.   OBJECTIVE:  Vital Signs: Vitals:   08/11/24 1145 08/11/24 1200 08/11/24 1215 08/11/24 1315  BP: (!) 161/92 (!) 173/97 107/64 (!) 154/83  Pulse: (!) 121 (!) 125 (!) 112 98  Resp: (!) 27 (!) 27 (!) 24 (!) 31  Temp:      TempSrc:      SpO2: 96% 96% 98% 96%  Weight:      Height:        Filed Weights   08/10/24 1348  Weight: 68 kg     Intake/Output Summary (Last 24 hours) at 08/11/2024 1401 Last data filed at 08/11/2024 0057 Gross per 24 hour  Intake 2004.57 ml  Output 650 ml  Net 1354.57 ml   Net IO Since Admission: 1,354.57 mL [08/11/24 1401]  Physical Exam: Constitutional: Ill-appearing elderly male in acute distress HENT: normocephalic atraumatic, mucous membranes moist Eyes: conjunctiva non-erythematous, PERRL, no scleral icterus Cardiovascular: tachycardic and rhythm, no m/r/g Pulmonary/Chest: gurgling on respiratory secretions; labored breathing on 4L[place], tachypneic with stridor; rhonchi heard in anterior lung fields Abdominal: soft, non-tender, non-distended, bowel sounds normal Neurological: alert & oriented x3, moving all extremities equally Skin: warm and dry Extremities: no edema or cyanosis; peripheral  pulses intact Psych: normal mood and affect, thought content normal  Patient Lines/Drains/Airways Status     Active Line/Drains/Airways     Name Placement date Placement time Site Days   Peripheral IV 08/10/24 20 G Anterior;Right Forearm 08/10/24  1352  Forearm  1   Peripheral IV 08/10/24 20 G Anterior;Left Forearm 08/10/24  1352  Forearm  1            Pertinent labs and imaging:     Latest Ref Rng & Units 08/11/2024    8:33 AM 08/11/2024    2:15 AM 08/10/2024    1:50 PM  CBC  WBC 4.0 - 10.5 K/uL  11.1  8.2   Hemoglobin 13.0 - 17.0 g/dL 82.9  84.0  85.0   Hematocrit 39.0 - 52.0 % 50.0  49.3  45.9   Platelets 150 - 400 K/uL  149  157        Latest Ref Rng & Units 08/11/2024    8:33 AM 08/11/2024    2:15 AM 08/10/2024    1:50 PM  CMP  Glucose 70 - 99 mg/dL  83  864   BUN 8 - 23 mg/dL  18  22   Creatinine 9.38 - 1.24 mg/dL  8.57  8.38   Sodium 864 - 145 mmol/L 135  138  135   Potassium 3.5 - 5.1 mmol/L 4.5  4.5  4.3   Chloride 98 - 111 mmol/L  99  100   CO2 22 - 32 mmol/L  23  23   Calcium 8.9 -  10.3 mg/dL  9.0  9.2   Total Protein 6.5 - 8.1 g/dL  6.8  6.8   Total Bilirubin 0.0 - 1.2 mg/dL  0.4  0.3   Alkaline Phos 38 - 126 U/L  63  60   AST 15 - 41 U/L  184  134   ALT 0 - 44 U/L  21  16     DG CHEST PORT 1 VIEW Result Date: 08/11/2024 EXAM: 1 VIEW(S) XRAY OF THE CHEST 08/11/2024 07:58:49 AM COMPARISON: Chest radiographs 08/10/2024 and prior. CLINICAL HISTORY: 79 year old male with hypoxemia, recent weakness, flu-like symptoms, and fall at home. FINDINGS: LUNGS AND PLEURA: Stable lung volumes. When allowing for portable technique, the lungs appear stable. Relatively symmetric bilateral infrahilar opacity now. This is suspicious for infection and consider aspiration. No consolidation. No pleural effusion. No pneumothorax. HEART AND MEDIASTINUM: Stable mediastinal contours. Prior sternotomy. BONES AND SOFT TISSUES: Stable visible bowel gas pattern. No acute osseous  abnormality. IMPRESSION: 1. Bilateral infrahilar opacities suspicious for infection, consider aspiration in this setting. 2. No consolidation or pleural effusion. Electronically signed by: Helayne Hurst MD 08/11/2024 08:03 AM EST RP Workstation: [id number]   CT Cervical Spine Wo Contrast Result Date: 08/10/2024 EXAM: CT CERVICAL SPINE WITHOUT CONTRAST 08/10/2024 03:38:05 PM TECHNIQUE: CT of the cervical spine was performed without the administration of intravenous contrast. Multiplanar reformatted images are provided for review. Automated exposure control, iterative reconstruction, and/or weight based adjustment of the mA/kV was utilized to reduce the radiation dose to as low as reasonably achievable. COMPARISON: None available. CLINICAL HISTORY: fall Fall. FINDINGS: BONES AND ALIGNMENT: No acute fracture or traumatic malalignment. DEGENERATIVE CHANGES: Disc space narrowing at multiple levels most pronounced at C3-C4. Small disc osteophyte complexes at multiple levels. No high grade osseous spinal canal stenosis. Facet arthrosis and uncovertebral hypertrophy at multiple levels. Foraminal stenosis is most pronounced at C3-C4 and C6-C7. SOFT TISSUES: No prevertebral soft tissue swelling. Atherosclerosis at the carotid bifurcations. IMPRESSION: 1. No acute cervical spine fracture or traumatic malalignment. Electronically signed by: Donnice Mania MD 08/10/2024 04:22 PM EST RP Workstation: [id number]   CT Head Wo Contrast Result Date: 08/10/2024 EXAM: CT HEAD WITHOUT CONTRAST 08/10/2024 03:38:05 PM TECHNIQUE: CT of the head was performed without the administration of intravenous contrast. Automated exposure control, iterative reconstruction, and/or weight based adjustment of the mA/kV was utilized to reduce the radiation dose to as low as reasonably achievable. COMPARISON: 04/24/2019 CLINICAL HISTORY: Patient fell. FINDINGS: BRAIN AND VENTRICLES: No acute hemorrhage. No evidence of acute infarct. No hydrocephalus. No  extra-axial collection. No mass effect or midline shift. Basal ganglia calcifications. Mild chronic microvascular ischemic changes. Left lens replacement. ORBITS: No acute abnormality. SINUSES: Scattered mucosal thickening in the ethmoid sinuses and partially visualized maxillary sinuses. SOFT TISSUES AND SKULL: Small right frontal scalp hematoma. There is additional mild soft tissue swelling in the left frontal scalp. No skull fracture. IMPRESSION: 1. No acute intracranial abnormality. 2. Small right frontal scalp hematoma and mild soft tissue swelling in the left frontal scalp. Electronically signed by: Donnice Mania MD 08/10/2024 04:18 PM EST RP Workstation: [id number]   DG Abdomen 1 View Result Date: 08/10/2024 EXAM: 1 VIEW XRAY OF THE ABDOMEN 08/10/2024 03:06:00 PM COMPARISON: Comparison with pelvis 08/10/2024. CLINICAL HISTORY: Abnormal pelvic x-ray. FINDINGS: LINES, TUBES AND DEVICES: Telemetry leads overlie the chest. BOWEL: Nonobstructive bowel gas pattern. Scattered gas and stool throughout the colon. Scattered gas-filled small bowel. No small or large bowel distention. Changes are likely due to ileus. SOFT TISSUES: No abnormal  calcifications. No radiopaque stones. BONES: Severe degenerative change of the lumbar spine. Sternotomy noted. No acute fracture. LUNG BASES: Lung bases are clear. IMPRESSION: 1. Nondilated gas filled bowel likely due to ileus. No small or large bowel distention. Electronically signed by: Elsie Gravely MD 08/10/2024 03:22 PM EST RP Workstation: [id number]   DG Pelvis Portable Result Date: 08/10/2024 EXAM: 1 or 2 VIEW(S) XRAY OF THE PELVIS 08/10/2024 02:02:00 PM COMPARISON: None available. CLINICAL HISTORY: Questionable sepsis. Evaluate for abnormality. FINDINGS: BONES AND JOINTS: No acute fracture. No malalignment. Degenerative changes of the visualized lower lumbar spine, including spondylosis and degenerative disc disease. Mild spurring of both acetabula. SOFT TISSUES:  Borderline dilated loop of left abdominal small bowel at 2.9 cm diameter. IMPRESSION: 1. No acute osseous abnormality of the pelvis. 2. Borderline dilated left abdominal small-bowel loop measuring 2.9 cm, which can be seen with ileus or early obstruction; consider further evaluation with dedicated abdominal imaging if clinically indicated. 3. Lower lumbar spondylosis and degenerative disc disease. 4. Mild degenerative spurring of both acetabula. Electronically signed by: Ryan Salvage MD 08/10/2024 02:39 PM EST RP Workstation: [id number]   DG Chest Port 1 View Result Date: 08/10/2024 EXAM: 1 VIEW(S) XRAY OF THE CHEST 08/10/2024 02:02:00 PM COMPARISON: None available. CLINICAL HISTORY: Questionable sepsis. Evaluate for abnormality. FINDINGS: LUNGS AND PLEURA: Patchy airspace opacity in the right infrahilar region. The lungs are otherwise clear. No pleural effusion. No pneumothorax. HEART AND MEDIASTINUM: No acute abnormality of the cardiac and mediastinal silhouettes. BONES AND SOFT TISSUES: Prior median sternotomy. IMPRESSION: 1. Patchy right infrahilar airspace opacity, suspicious for pneumonia. 2. Prior median sternotomy. Electronically signed by: Ryan Salvage MD 08/10/2024 02:38 PM EST RP Workstation: [id number]    ASSESSMENT/PLAN:  Assessment: Principal Problem:   Acute hypoxic respiratory failure (HCC) Active Problems:   Fall   Pneumonia of right middle lobe due to infectious organism   Sepsis (HCC)   Zachary Heath is a 79 y.o. male with a history of Parkinson's disease, CAD s/p CABG (2022), CKD3b, hypothyroidism, asthma, HTN and GERD who presented with flu-like symptoms + fall and was admitted for sepsis 2/2 PNA, now on hospital day 0  Plan: #Sepsis 2/2 Pneumonia, suspected aspiration #Acute Hypoxic Respiratory Failure #Influenza A Patient decompensated overnight with persistent tachycardia, continuing fevers, and increased oxygen requirement to 3L [place].  Initially was  concerned due to patient's somnolence and inability to protect airway with stridor.  Consulted PCCM for potential transfer for higher level of care, however the patient improved after deep suctioning; he was minimally tachypneic on 2L [place] during PCCM eval.  Main concern is sepsis from likely aspiration pneumonia given dysphagia and history of Parkinson's disease.  Could be secondary bacterial PNA given Flu A positive, but timeline does not necessarily support this. Repeat VBG from earlier this a.m. showed mild respiratory alkalosis without compensation, likely in the setting of tachypnea.  Lactic acid still within normal range.  Will continue with ceftriaxone  and doxycycline  pending MRSA nares as well as Tamiflu . Holding anaerobic coverage as patient does not have abscess or empyema. Will tailor based on blood cultures. - MRSA Nares pending - Continue IV ceftriaxone , pharmacy to dose - Continue IV doxycyline 100 mg  - Continue Tamiflu  30 mg bid (5 total days, loaded on 1/25) - Mucinex  600 mg bid - Blood cultures x2 pending - Continuous cardiac monitoring  #Parkinson's Disease #Dysphagia #Falls with scalp hematoma Patient with consistent dysphagia this morning, gurgling on respiratory secretions, concern for aspiration.  Evaluated by SLP who  recommended full liquid diet as this patient has a moderate risk of aspiration.  Still awaiting MBS for further evaluation as patient declined initially.  Patient's dysphagia likely in the setting of known Parkinson's disease.  He follows with Dr. Deward Moles and takes carbidopa -levodopa , rasagiline , ropinirole .  Suspect Parkinson's disease (or medications for Parkinson's) contributed to fall prior to hospitalization.  Both PT/OT evaluated the patient and recommended discharge to SNF.  - Full liquid diet (can give PO meds with pures) - MBS pending, likely tomorrow - Continue home carbidopa , rasagiline , ropinirole  - Pain regimen:  - Acetaminophen  650 mg q6h PRN    #Hypertensive Urgency BP increased to 200s systolic overnight and this AM. Required IV pushes of metoprolol  and labetalol  this morning. Home medicaitons include include amlodipine  5 mg daily, Losartan  12.5 mg daily, metoprolol  tartrate 25 mg bid. Previously were not started due to concerns for dysphagia. Will resume home oral meds except losartan  d/t c/f renal function. - Start PO amlodipine  5 mg daily - Start PO metoprolol  tartrate 25 mg bid - Hold home losartan   #Elevated Troponins #CAD s/p CABG Patient again denying any active chest pain.  Initial EKG showed ST elevations in V1/V2 but no reciprocal ST depressions.  These are chronic ST elevations seen on prior tracings.  Troponins this admission elevated at 111? 117 (flat delta).  Low concern for ACS.  Elevation in troponin likely in the setting of demand ischemia vs. CKD 3B.  Will continue home aspirin . - Continue home ASA 81 mg daily  #Rhabdomyolysis #CKD3b Follows with Dr. Chalmers and prescribed Lasix  MWF prn but patient is not taking. Cr at baseline 1.6 upon admission. Patient found down for prolonged period, now with positive Hgb without RBCs. CK elevation to 4670 with Isolated AST elevation both pointing towards rhabdomyolysis. Received 1 L bolus by EDP. Will continue fluids, avoid nephrotoxic agents, and trending renal function. - LR 1L bolus - Strict I's/O's - Trend CMPs - Hold nephrotoxic agents - Hold home statin, losartan , empagliflozin  #Prediabetes #Hypoglycemia Per chart review, patient has history of prediabetic range A1c. CBG earlier this AM of 67 likely representing poor oral intake. Given D50 with improvement in sugars. Now that patient is on FLD, suspect that will be able to maintain normoglycemic range. - Continue to monitor   #Radiographic findings of early ileus Patient denies any constipation or abdominal pain. Will start bowel regimen for him. - Miralax /Glycolax  17 g daily   #GOC Patient previously DNR/DNI  but wished to change to Full Code on admission. He had capacity at the time the decision was made.  We will happily honor his wishes. Discussed with his daughter who is surprised at his change in decision. The patient does not recall prior DNR/DNI status.   Chronic Stable Medical Conditions #GERD - continue home Protonix  40 mg bid #Hypothyroidism - continue home Levothyroxine  #HLD - hold home statin due to rhabdomyolysis  Best Practice: Diet: Full Liquid IVF: Fluids: LR, Rate: 1000 cc bolus x 200cc/hr VTE: enoxaparin  (LOVENOX ) injection 40 mg Start: 08/10/24 1800 Code: Full  Disposition planning: Therapy Recs: SNF, DME: none Family Contact: [name] (daughter), at bedside. DISPO: Anticipated discharge to Skilled nursing facility pending clinical improvement.  Signature:  Letha Cheadle, MD [facility]  PGY-1 08/11/2024, 2:01 PM  On Call pager [phone]  "

## 2024-08-11 NOTE — ED Notes (Signed)
 Called and made floor aware of PT arrival

## 2024-08-11 NOTE — ED Notes (Signed)
 PT having difficulty drink OJ at this time. PT took 2 sips and was unable to drink anymore. This RN will reach out for a one time dose of D10 from physician.

## 2024-08-11 NOTE — ED Notes (Signed)
 Pt now states he wants to get up and sit. Pt is much more alert at this time and breathing is improved. Pt keeps asking to get up and putting his legs thru the handrail slots on the stretcher. Pt redirected and is now asleep. Pt AOx3 stating his name, where we are and that he fell but year is incorrect. Fall precautions in place with socks, bracelet and posey.

## 2024-08-11 NOTE — ED Notes (Signed)
 Patient alert to self, able to follow commands denies pain. Confirmed he feels like his breathing is better.

## 2024-08-11 NOTE — TOC CM/SW Note (Signed)
 Received call from [facility]. Patient is 30% service connected. He receives he's medications from the [facility]. Pcp is Dr. Delana. [facility] also has [facility] psychotherap[facility] that can be reached at [phone] ext [phone]. Patient receives 11 hours per week of caregiver services.   Merilee Batty, MSN, RN Case Management [phone]

## 2024-08-11 NOTE — ED Notes (Signed)
 PT's brief and bed was changed.New gown placed on PT and PT repositioned. CBG rechecked, PT now has a CBG of 185.

## 2024-08-11 NOTE — ED Notes (Signed)
 Speech Therapist at bedside

## 2024-08-11 NOTE — Evaluation (Signed)
 Clinical/Bedside Swallow Evaluation Patient Details  Name: Zachary Heath MRN: [id number] Date of Birth: 29-Jan-1946  Today's Date: 08/11/2024 Time: SLP Start Time (ACUTE ONLY): 1019 SLP Stop Time (ACUTE ONLY): 1038 SLP Time Calculation (min) (ACUTE ONLY): 19 min  Past Medical History:  Past Medical History:  Diagnosis Date   Barrett's esophagus    Coronary artery disease    GERD (gastroesophageal reflux disease)    Glossodynia    Hypertension    Parkinson's disease (HCC)    Past Surgical History:  Past Surgical History:  Procedure Laterality Date   CARDIAC CATHETERIZATION     CORONARY ARTERY BYPASS GRAFT  01/14/2021   Dr Rosalynn Dines at [facility]   HPI:  79 yo male presenting to ED 1/25 with URI symptoms and a fall. Found to be Flu A positive and CT Abdomen shows potential ileus. CXR with bilateral infrahilar opacities suspicious for infection. CT Cervical Spine negative for acute fx but shows disc space narrowing at multiple levels, most pronounced at C3-C4 and small disc osteophyte complexes at multiple levels. MBS 01/25/21 with no penetration/aspiration and mild oral deficits in addition to a suspected esophageal component with reported globus sensation. Esophagram 12/14/17 shows evidence of prior Nissen fundoplication without evidence of recurrent hernia and although widely patent, the 13 mm barium pill would not pass through it. PMH: Parkinson's disease, CAD s/p CABG (2022), CKD 3B, hypothyroidism, asthma, HTN, Barrett's esophagus, GERD    Assessment / Plan / Recommendation  Clinical Impression  Given respiratory status and lethargy, will start with a full liquid diet. Give meds whole in puree. Discussed factors that may increase risk for pneumonia in the presence of aspiration, including WOB as well as his history of Parkinson's and GERD. Would like to assess further with an MBS but pt asks that it be done at a later time. SLP will f/u as scheduling allows.    Pt endorses difficulty swalloiwng solids with globus sensation. He currently presents with increased WOB on 2L [place] and snoring-like respirations. He needs occasional cueing for alertness. Despite this, oral transit is functional and there are no s/s of aspiration with thin liquids or regular solids.   SLP Visit Diagnosis: Dysphagia, unspecified (R13.10)    Aspiration Risk  Moderate aspiration risk    Diet Recommendation           Other Recommendations Oral Care Recommendations: Oral care BID     Swallow Evaluation Recommendations Recommendations: PO diet PO Diet Recommendation: Full liquid diet Liquid Administration via: Spoon;Cup;Straw Medication Administration: Whole meds with puree Supervision: Staff to assist with self-feeding;Full supervision/cueing for swallowing strategies Swallowing strategies  : Minimize environmental distractions;Slow rate;Small bites/sips Postural changes: Position pt fully upright for meals;Stay upright 30-60 min after meals Oral care recommendations: Oral care BID (2x/day) Recommended consults: Consider esophageal assessment   Assistance Recommended at Discharge    Functional Status Assessment Patient has had a recent decline in their functional status and demonstrates the ability to make significant improvements in function in a reasonable and predictable amount of time.  Frequency and Duration min 2x/week  2 weeks       Prognosis Prognosis for improved oropharyngeal function: Good Barriers to Reach Goals: Time post onset      Swallow Study   General HPI: 79 yo male presenting to ED 1/25 with URI symptoms and a fall. Found to be Flu A positive and CT Abdomen shows potential ileus. CXR with bilateral infrahilar opacities suspicious for infection. CT Cervical Spine  negative for acute fx but shows disc space narrowing at multiple levels, most pronounced at C3-C4 and small disc osteophyte complexes at multiple levels. MBS 01/25/21 with no  penetration/aspiration and mild oral deficits in addition to a suspected esophageal component with reported globus sensation. Esophagram 12/14/17 shows evidence of prior Nissen fundoplication without evidence of recurrent hernia and although widely patent, the 13 mm barium pill would not pass through it. PMH: Parkinson's disease, CAD s/p CABG (2022), CKD 3B, hypothyroidism, asthma, HTN, Barrett's esophagus, GERD Type of Study: Bedside Swallow Evaluation Previous Swallow Assessment: see HPI Diet Prior to this Study: NPO Temperature Spikes Noted: No Respiratory Status: Nasal cannula History of Recent Intubation: No Behavior/Cognition: Alert;Cooperative;Requires cueing Oral Cavity Assessment: Within Functional Limits Oral Care Completed by SLP: No Oral Cavity - Dentition: Adequate natural dentition Vision: Functional for self-feeding Self-Feeding Abilities: Needs assist Patient Positioning: Upright in bed Baseline Vocal Quality: Low vocal intensity Volitional Cough: Congested Volitional Swallow: Able to elicit    Oral/Motor/Sensory Function Overall Oral Motor/Sensory Function: Within functional limits   Ice Chips Ice chips: Not tested   Thin Liquid Thin Liquid: Within functional limits Presentation: Straw;Cup    Nectar Thick Nectar Thick Liquid: Not tested   Honey Thick Honey Thick Liquid: Not tested   Puree Puree: Not tested   Solid     Solid: Within functional limits      Damien Blumenthal, M.A., CCC-SLP Speech Language Pathology, Acute Rehabilitation Services  Secure Chat preferred [phone]  08/11/2024,11:53 AM

## 2024-08-11 NOTE — Consult Note (Signed)
 "  NAME:  Zachary Heath, MRN:  [id number], DOB:  Jun 15, 1946, LOS: 0 ADMISSION DATE:  08/10/2024, CONSULTATION DATE:  08/11/24 REFERRING MD:  Dr. Francesco , CHIEF COMPLAINT:  SOB   History of Present Illness:   64 yoM with hx of former smoker, asthma, Parkinson's disease, CAD s/p CABG, CKD3b, hypothyroidism, HTN, GERD  Presented to ER 1/25 after development over last 24hrs of multiple falls at home, found down on floor for ~9hrs overnight, productive cough with yellowish sputum, fever, chills, generalized weakness, rhinorrhea.  Also recent poor PO intake over several days.  Lives with daughter.   In ER, found febrile 103 rectal, tachypneic, 95% on RA.  On workup, found to be Flu A positive and CXR with bilateral infrahilar opacities suspicious for aspiration.  Lactic reassuring, PCT 0.32, WBC 11.1, sCr 1.6> 1.42, troponin 111> 117, pBNP 2711, CK 4670.   CTH neg for acute intracranial abnormality, CT cspine neg, AXR with dilated bowel loop concerning for possible ileus vs obstruction.  Started on empiric doxycycline , ceftriaxone  and tamiflu  and admitted to IMTS.   Noted to having increasing respiratory distress requiring NTS this am therefore PCCM consulted.  More hypertensive in which IMTS is adjusting his home medications.  Remains on 3L [place] at 94-100%.  Pt currently reports he is feeling better, currently denies any pain, SOB, N/V/D, abd pain.    Pertinent  Medical History   Past Medical History:  Diagnosis Date   Barrett's esophagus    Coronary artery disease    GERD (gastroesophageal reflux disease)    Glossodynia    Hypertension    Parkinson's disease (HCC)    Significant Hospital Events: Including procedures, antibiotic start and stop dates in addition to other pertinent events   1/25 admit ITMS  Interim History / Subjective:   Objective    Blood pressure (!) 190/97, pulse (!) 109, temperature 99 F (37.2 C), temperature source Axillary, resp. rate (!) 29, height 5' 7 (1.702  m), weight 68 kg, SpO2 94%.        Intake/Output Summary (Last 24 hours) at 08/11/2024 9061 Last data filed at 08/11/2024 0057 Gross per 24 hour  Intake 2004.57 ml  Output 650 ml  Net 1354.57 ml   Filed Weights   08/10/24 1348  Weight: 68 kg   Examination: General:  Elderly male sitting upright on ER stretcher in NAD HEENT: MM pink/very dry, some dysarthria suspected due to dry MM Neuro: Alert, oriented x3, MAE, no focal findings CV: rr PULM:  non labored, tachypneic 22, clear, fair cough, no wheeze GI: soft, bs+, NT Extremities: warm/dry, no LE edema  Skin: no rashes   Labs/ imaging reviewed> VBG 7.44/34  Resolved problem list   Assessment and Plan   Acute hypoxic respiratory failure Flu A positive Aspiration PNA vs viral PNA R/o dysphagia  Hx asthma GERD Parkinson's HTN CAD s/p prior CABG P:  - no ICU needs at this time from respiratory standpoint.  Ok to admit to IMTS, relayed with Dr. Jolaine. Noted to be more hypertensive this am, asymptomatic, IMTS adjusting home meds.   - cont supplemental O2 for sat goal > 92%, currently on 2L  - prn albuterol  - cont empiric CAP coverage and tamiflu   - pending swallow eval to r/o dysphagia with parkinson's - aspiration precautions - PPI    Remainder per IMTS, PCCM available as needed, please call if any changes or further concerns.  Labs   CBC: Recent Labs  Lab 08/10/24 1350 08/11/24 0215 08/11/24  0833  WBC 8.2 11.1*  --   NEUTROABS 7.4  --   --   HGB 14.9 15.9 17.0  HCT 45.9 49.3 50.0  MCV 90.0 90.0  --   PLT 157 149*  --     Basic Metabolic Panel: Recent Labs  Lab 08/10/24 1350 08/11/24 0215 08/11/24 0833  NA 135 138 135  K 4.3 4.5 4.5  CL 100 99  --   CO2 23 23  --   GLUCOSE 135* 83  --   BUN 22 18  --   CREATININE 1.61* 1.42*  --   CALCIUM 9.2 9.0  --    GFR: Estimated Creatinine Clearance: 38.8 mL/min (A) (by C-G formula based on SCr of 1.42 mg/dL (H)). Recent Labs  Lab 08/10/24 1350  08/10/24 1416 08/11/24 0215 08/11/24 0833  PROCALCITON 0.32  --   --   --   WBC 8.2  --  11.1*  --   LATICACIDVEN  --  1.0  --  1.6    Liver Function Tests: Recent Labs  Lab 08/10/24 1350 08/11/24 0215  AST 134* 184*  ALT 16 21  ALKPHOS 60 63  BILITOT 0.3 0.4  PROT 6.8 6.8  ALBUMIN 3.8 3.8   No results for input(s): LIPASE, AMYLASE in the last 168 hours. No results for input(s): AMMONIA in the last 168 hours.  ABG    Component Value Date/Time   HCO3 23.7 08/11/2024 0833   TCO2 25 08/11/2024 0833   O2SAT 89 08/11/2024 0833     Coagulation Profile: No results for input(s): INR, PROTIME in the last 168 hours.  Cardiac Enzymes: Recent Labs  Lab 08/10/24 1350  CKTOTAL 4,670*    HbA1C: No results found for: HGBA1C  CBG: Recent Labs  Lab 08/11/24 0220 08/11/24 0257 08/11/24 0641  GLUCAP 67* 185* 112*    Review of Systems:   Review of Systems  Constitutional:  Positive for chills and fever.  Respiratory:  Positive for cough and sputum production. Negative for wheezing.   Cardiovascular:  Negative for chest pain and leg swelling.  Gastrointestinal:  Negative for abdominal pain, nausea and vomiting.  Musculoskeletal:  Positive for falls.  Neurological:  Positive for weakness. Negative for focal weakness and loss of consciousness.   Past Medical History:  He,  has a past medical history of Barrett's esophagus, Coronary artery disease, GERD (gastroesophageal reflux disease), Glossodynia, Hypertension, and Parkinson's disease (HCC).   Surgical History:   Past Surgical History:  Procedure Laterality Date   CARDIAC CATHETERIZATION     CORONARY ARTERY BYPASS GRAFT  01/14/2021   Dr Rosalynn Dines at [facility]     Social History:   reports that he has never smoked. He has never used smokeless tobacco. He reports that he does not drink alcohol and does not use drugs.   Family History:  His family history is not on file.    Allergies Allergies[1]   Home Medications  Prior to Admission medications  Medication Sig Start Date End Date Taking? Authorizing Provider  empagliflozin (JARDIANCE) 25 MG TABS tablet Take 12.5 mg by mouth daily.   Yes [provider]  losartan  (COZAAR ) 25 MG tablet Take 12.5 mg by mouth daily.   Yes [provider]  aspirin  EC 81 MG tablet Take 162 mg by mouth daily. Swallow whole.    [provider]  Carbidopa  25 MG tablet Take 25 mg by mouth 4 (four) times daily.    [provider]  carbidopa -levodopa  (SINEMET  CR) 50-200 MG tablet Take 1 tablet by mouth at bedtime.    [provider]  carbidopa -levodopa  (SINEMET  IR) 25-100 MG tablet Take 1 tablet by mouth 4 (four) times daily.  11/19/18   [provider]  Carboxymethylcellul-Glycerin (LUBRICATING EYE DROPS OP) Place 1 drop into both eyes 2 (two) times daily.    [provider]  cetirizine (ZYRTEC) 10 MG tablet Take 10 mg by mouth daily. Patient not taking: Reported on 11/25/2021    [provider]  Cholecalciferol (VITAMIN D) 50 MCG (2000 UT) tablet Take 2,000 Units by mouth daily. Patient not taking: Reported on 11/25/2021    [provider]  ferrous sulfate 325 (65 FE) MG tablet Take 325 mg by mouth daily.    [provider]  folic acid (FOLVITE) 400 MCG tablet Take 400 mcg by mouth daily.    [provider]  furosemide  (LASIX ) 40 MG tablet Take 40 mg by mouth every other day. Patient not taking: Reported on 11/25/2021    [provider]  magnesium oxide (MAG-OX) 400 MG tablet Take 400 mg by mouth daily.    [provider]  metoprolol  tartrate (LOPRESSOR ) 50 MG tablet Take 50 mg by mouth 2 (two) times daily.    [provider]  pantoprazole  (PROTONIX ) 40 MG tablet Take 40 mg by mouth 2 (two) times daily. 03/25/19   [provider]  rasagiline  (AZILECT ) 1 MG TABS tablet Take 1 mg by mouth daily. 04/09/19    [provider]  rOPINIRole  (REQUIP ) 1 MG tablet Take 1 mg by mouth in the morning, at noon, in the evening, and at bedtime.    [provider]  rosuvastatin (CRESTOR) 40 MG tablet Take 40 mg by mouth daily.    [provider]  zinc gluconate 50 MG tablet Take 50 mg by mouth daily.    [provider]  amLODipine  (NORVASC ) 5 MG tablet Take 1 tablet (5 mg total) by mouth daily. 04/24/19 04/24/19  Dreama Longs, MD     Critical care time: n/a      Lyle Pesa, NP [facility] 08/11/2024, 9:38 AM  See Amion for pager If no response to pager , please call [phone] until 7pm After 7:00 pm call Elink  [phone]           [1]  Allergies Allergen Reactions   Penicillin G Anaphylaxis   "

## 2024-08-11 NOTE — Evaluation (Signed)
 Occupational Therapy Evaluation Patient Details Name: Zachary Heath MRN: [id number] DOB: 02-23-1946 Today's Date: 08/11/2024   History of Present Illness   Patient is a 79 yo male presenting to the ED with falls, cough, and SOB on 08/10/24. Found to be Flu A positive. PMHx of Parkinson's disease, CAD s/p CABG (2022), CKD3b, hypothyroidism, asthma, HTN and GERD     Clinical Impressions Prior to this admission, patient living with his daughter, and typically independent. OT is unsure of what level of independence as patient has a stair lift, is in the process of getting an electric wheelchair, and was unable to state prior level clearly. Currently, patient on 3L O2, with significant congestion. Patient benefitting from being sat upright, however requiring mod A of 2 to complete. Patient able to stand with min A of 2, but unable to take steps despite manual weight shifting from OT. Given current level, OT recommending rehab of lesser intensity < 3 hours, OT will continue to follow acutely.   HR 110s to 120s throughout session VSS on 3L O2     If plan is discharge home, recommend the following:   Two people to help with walking and/or transfers;A lot of help with bathing/dressing/bathroom;Assistance with cooking/housework;Direct supervision/assist for medications management;Direct supervision/assist for financial management;Assist for transportation;Help with stairs or ramp for entrance;Supervision due to cognitive status     Functional Status Assessment   Patient has had a recent decline in their functional status and demonstrates the ability to make significant improvements in function in a reasonable and predictable amount of time.     Equipment Recommendations   Other (comment) (defer to next venue)     Recommendations for Other Services         Precautions/Restrictions   Precautions Precautions: Other (comment) Recall of Precautions/Restrictions:  Intact Precaution/Restrictions Comments: watch HR and O2 Restrictions Weight Bearing Restrictions Per Provider Order: No     Mobility Bed Mobility Overal bed mobility: Needs Assistance Bed Mobility: Supine to Sit, Sit to Supine     Supine to sit: +2 for physical assistance, Mod assist, HOB elevated Sit to supine: +2 for physical assistance, Mod assist   General bed mobility comments: Assist to bring legs off, elevate trunk into sitting and bring hips to EOB. Assist to lower trunk and bring legs back up onto stretcher.    Transfers Overall transfer level: Needs assistance Equipment used: 2 person hand held assist Transfers: Sit to/from Stand Sit to Stand: +2 physical assistance, Min assist           General transfer comment: Assist to power up and stabilize      Balance Overall balance assessment: Needs assistance Sitting-balance support: No upper extremity supported, Feet supported Sitting balance-Leahy Scale: Fair     Standing balance support: Bilateral upper extremity supported, During functional activity Standing balance-Leahy Scale: Poor Standing balance comment: +2 min assist and UE support                           ADL either performed or assessed with clinical judgement   ADL Overall ADL's : Needs assistance/impaired Eating/Feeding: Set up;Sitting   Grooming: Wash/dry hands;Wash/dry face;Set up;Sitting Grooming Details (indicate cue type and reason): increased time Upper Body Bathing: Sitting;Minimal assistance   Lower Body Bathing: Maximal assistance;Sit to/from stand;Sitting/lateral leans   Upper Body Dressing : Minimal assistance;Sitting   Lower Body Dressing: Maximal assistance;Sitting/lateral leans;Sit to/from stand   Toilet Transfer: Minimal assistance;+2 for safety/equipment;+2 for physical  assistance;BSC/3in1 Statistician Details (indicate cue type and reason): simulated with sit<>stand Toileting- Clothing Manipulation and  Hygiene: Total assistance;Sitting/lateral lean;Sit to/from stand Toileting - Clothing Manipulation Details (indicate cue type and reason): unable to complete peri-care due to weakness     Functional mobility during ADLs: Moderate assistance;+2 for safety/equipment;+2 for physical assistance;Cueing for safety;Cueing for sequencing General ADL Comments: Prior to this admission, patient living with his daughter, and typically independent. OT is unsure of what level of independence as patient has a stair lift, is in the process of getting an electric wheelchair, and was unable to state prior level clearly. Currently, patient on 3L O2, with significant congestion. Patient benefitting from being sat upright, however requiring mod A of 2 to complete. Patient able to stand with min A of 2, but unable to take steps despite manual weight shifting from OT. Given current level, OT recommending rehab of lesser intensity < 3 hours, OT will continue to follow acutely.     Vision Baseline Vision/History: 1 Wears glasses Ability to See in Adequate Light: 0 Adequate Patient Visual Report: No change from baseline Vision Assessment?: No apparent visual deficits     Perception Perception: Not tested       Praxis Praxis: Not tested       Pertinent Vitals/Pain Pain Assessment Pain Assessment: No/denies pain     Extremity/Trunk Assessment Upper Extremity Assessment Upper Extremity Assessment: Generalized weakness;Right hand dominant   Lower Extremity Assessment Lower Extremity Assessment: Defer to PT evaluation   Cervical / Trunk Assessment Cervical / Trunk Assessment: Kyphotic   Communication Communication Communication: No apparent difficulties   Cognition Arousal: Alert Behavior During Therapy: WFL for tasks assessed/performed Cognition: Difficult to assess Difficult to assess due to: Level of arousal           OT - Cognition Comments: decreased arousal, did answer all questions  appropriately, long pauses between questions, unsure of accuracy of home set up                 Following commands: Impaired Following commands impaired: Follows multi-step commands with increased time     Cueing  General Comments   Cueing Techniques: Verbal cues;Tactile cues  HR 110-120s with activity, VSS on 3L O2   Exercises     Shoulder Instructions      Home Living Family/patient expects to be discharged to:: Private residence Living Arrangements: Children Available Help at Discharge: Available PRN/intermittently;Family Type of Home: House Home Access: Other (comment) (stair lift)           Bathroom Shower/Tub: Chief Strategy Officer: Standard     Home Equipment: Cane - single Librarian, Academic (2 wheels);Wheelchair - Careers Adviser (comment);Shower seat (stair lift)          Prior Functioning/Environment Prior Level of Function : Independent/Modified Independent             Mobility Comments: No assistive device. ADLs Comments: Mod I for ADL's    OT Problem List: Decreased strength;Decreased range of motion;Decreased activity tolerance;Impaired balance (sitting and/or standing);Decreased coordination;Decreased cognition;Decreased safety awareness   OT Treatment/Interventions: Self-care/ADL training;Therapeutic exercise;Energy conservation;DME and/or AE instruction;Manual therapy;Cognitive remediation/compensation;Patient/family education;Balance training      OT Goals(Current goals can be found in the care plan section)   Acute Rehab OT Goals Patient Stated Goal: unable OT Goal Formulation: Patient unable to participate in goal setting Time For Goal Achievement: 08/25/24 Potential to Achieve Goals: Fair ADL Goals Pt Will Perform Lower Body Bathing: with modified independence;sit to/from  stand;sitting/lateral leans Pt Will Perform Lower Body Dressing: with modified independence;sitting/lateral leans;sit to/from stand Pt Will  Transfer to Toilet: with modified independence;ambulating;regular height toilet Pt Will Perform Toileting - Clothing Manipulation and hygiene: with modified independence;sitting/lateral leans;sit to/from stand Additional ADL Goal #1: Patient will be able to complete bed mobility at [facility] as a precursor to OOB activities. Additional ADL Goal #2: Patient will be able to complete functional task in standing for 3-5 minutes without seated rest break in order to increase overall activity tolerance.   OT Frequency:  Min 2X/week    Co-evaluation   Reason for Co-Treatment: For patient/therapist safety;Complexity of the patient's impairments (multi-system involvement) PT goals addressed during session: Mobility/safety with mobility;Balance OT goals addressed during session: ADL's and self-care;Strengthening/ROM      AM-PAC OT 6 Clicks Daily Activity     Outcome Measure Help from another person eating meals?: A Little Help from another person taking care of personal grooming?: A Little Help from another person toileting, which includes using toliet, bedpan, or urinal?: A Lot Help from another person bathing (including washing, rinsing, drying)?: A Lot Help from another person to put on and taking off regular upper body clothing?: A Little Help from another person to put on and taking off regular lower body clothing?: A Lot 6 Click Score: 15   End of Session Nurse Communication: Mobility status  Activity Tolerance: Patient limited by fatigue;Patient limited by lethargy Patient left: in bed;with call bell/phone within reach  OT Visit Diagnosis: Unsteadiness on feet (R26.81);Other abnormalities of gait and mobility (R26.89);Muscle weakness (generalized) (M62.81);Other symptoms and signs involving cognitive function                Time: 1100-1115 OT Time Calculation (min): 15 min Charges:  OT General Charges $OT Visit: 1 Visit OT Evaluation $OT Eval Moderate Complexity: 1 Mod  Ronal Gift E.  Kirin Brandenburger, OTR/L Acute Rehabilitation Services [phone]   Ronal Gift Salt 08/11/2024, 1:55 PM

## 2024-08-11 NOTE — ED Notes (Addendum)
 At 1354 RT contacted via department mobile. Difficult to understand person at first, introduced myself calling from ED. Communicated the need for deep suctioning again requested by provider Fairy Pool. RT informed me that we would have to figure it out. Continue to further explain multiple intubations and no one else to cover. Asked if there was someone else to call and told I just told you there is no one, have to figure it out, deep suctioning is not a priority. Charge nurse Warren KATHEE Certain of conversation.

## 2024-08-11 NOTE — ED Notes (Signed)
 Physical Therapy at bedside.

## 2024-08-11 NOTE — ED Notes (Signed)
 RT called for deep suctioning per verbal order from Dr. Francesco

## 2024-08-12 ENCOUNTER — Inpatient Hospital Stay (HOSPITAL_COMMUNITY)

## 2024-08-12 DIAGNOSIS — J1000 Influenza due to other identified influenza virus with unspecified type of pneumonia: Secondary | ICD-10-CM | POA: Diagnosis not present

## 2024-08-12 DIAGNOSIS — A419 Sepsis, unspecified organism: Secondary | ICD-10-CM | POA: Diagnosis not present

## 2024-08-12 DIAGNOSIS — G20A1 Parkinson's disease without dyskinesia, without mention of fluctuations: Secondary | ICD-10-CM | POA: Diagnosis not present

## 2024-08-12 DIAGNOSIS — R6520 Severe sepsis without septic shock: Secondary | ICD-10-CM | POA: Diagnosis not present

## 2024-08-12 DIAGNOSIS — E162 Hypoglycemia, unspecified: Secondary | ICD-10-CM

## 2024-08-12 DIAGNOSIS — W19XXXA Unspecified fall, initial encounter: Secondary | ICD-10-CM | POA: Diagnosis not present

## 2024-08-12 DIAGNOSIS — J101 Influenza due to other identified influenza virus with other respiratory manifestations: Secondary | ICD-10-CM

## 2024-08-12 DIAGNOSIS — R748 Abnormal levels of other serum enzymes: Secondary | ICD-10-CM

## 2024-08-12 DIAGNOSIS — M6282 Rhabdomyolysis: Secondary | ICD-10-CM | POA: Diagnosis not present

## 2024-08-12 DIAGNOSIS — I2510 Atherosclerotic heart disease of native coronary artery without angina pectoris: Secondary | ICD-10-CM | POA: Diagnosis not present

## 2024-08-12 DIAGNOSIS — I129 Hypertensive chronic kidney disease with stage 1 through stage 4 chronic kidney disease, or unspecified chronic kidney disease: Secondary | ICD-10-CM | POA: Diagnosis not present

## 2024-08-12 DIAGNOSIS — R1310 Dysphagia, unspecified: Secondary | ICD-10-CM | POA: Diagnosis not present

## 2024-08-12 DIAGNOSIS — J9601 Acute respiratory failure with hypoxia: Secondary | ICD-10-CM | POA: Diagnosis not present

## 2024-08-12 DIAGNOSIS — N1832 Chronic kidney disease, stage 3b: Secondary | ICD-10-CM | POA: Diagnosis not present

## 2024-08-12 DIAGNOSIS — S0003XA Contusion of scalp, initial encounter: Secondary | ICD-10-CM | POA: Diagnosis not present

## 2024-08-12 DIAGNOSIS — N179 Acute kidney failure, unspecified: Secondary | ICD-10-CM | POA: Diagnosis not present

## 2024-08-12 LAB — CBC WITH DIFFERENTIAL/PLATELET
Basophils Absolute: 0 10*3/uL (ref 0.0–0.1)
Basophils Relative: 0 %
Eosinophils Absolute: 0 10*3/uL (ref 0.0–0.5)
Eosinophils Relative: 0 %
HCT: 50.4 % (ref 39.0–52.0)
Hemoglobin: 16.4 g/dL (ref 13.0–17.0)
Lymphocytes Relative: 5 %
Lymphs Abs: 0.5 10*3/uL — ABNORMAL LOW (ref 0.7–4.0)
MCH: 29.1 pg (ref 26.0–34.0)
MCHC: 32.5 g/dL (ref 30.0–36.0)
MCV: 89.5 fL (ref 80.0–100.0)
Monocytes Absolute: 0.5 10*3/uL (ref 0.1–1.0)
Monocytes Relative: 5 %
Neutro Abs: 9.8 10*3/uL — ABNORMAL HIGH (ref 1.7–7.7)
Neutrophils Relative %: 90 %
Platelets: 153 10*3/uL (ref 150–400)
RBC: 5.63 MIL/uL (ref 4.22–5.81)
RDW: 14.1 % (ref 11.5–15.5)
WBC: 10.9 10*3/uL — ABNORMAL HIGH (ref 4.0–10.5)
nRBC: 0 % (ref 0.0–0.2)

## 2024-08-12 LAB — COMPREHENSIVE METABOLIC PANEL WITH GFR
ALT: 22 U/L (ref 0–44)
AST: 186 U/L — ABNORMAL HIGH (ref 15–41)
Albumin: 3.4 g/dL — ABNORMAL LOW (ref 3.5–5.0)
Alkaline Phosphatase: 56 U/L (ref 38–126)
Anion gap: 16 — ABNORMAL HIGH (ref 5–15)
BUN: 20 mg/dL (ref 8–23)
CO2: 22 mmol/L (ref 22–32)
Calcium: 9.5 mg/dL (ref 8.9–10.3)
Chloride: 101 mmol/L (ref 98–111)
Creatinine, Ser: 1.32 mg/dL — ABNORMAL HIGH (ref 0.61–1.24)
GFR, Estimated: 55 mL/min — ABNORMAL LOW
Glucose, Bld: 79 mg/dL (ref 70–99)
Potassium: 4.4 mmol/L (ref 3.5–5.1)
Sodium: 139 mmol/L (ref 135–145)
Total Bilirubin: 0.4 mg/dL (ref 0.0–1.2)
Total Protein: 6.7 g/dL (ref 6.5–8.1)

## 2024-08-12 LAB — POCT I-STAT 7, (LYTES, BLD GAS, ICA,H+H)
Acid-base deficit: 3 mmol/L — ABNORMAL HIGH (ref 0.0–2.0)
Bicarbonate: 21.4 mmol/L (ref 20.0–28.0)
Calcium, Ion: 1.25 mmol/L (ref 1.15–1.40)
HCT: 49 % (ref 39.0–52.0)
Hemoglobin: 16.7 g/dL (ref 13.0–17.0)
O2 Saturation: 92 %
Patient temperature: 98.6
Potassium: 4.1 mmol/L (ref 3.5–5.1)
Sodium: 141 mmol/L (ref 135–145)
TCO2: 22 mmol/L (ref 22–32)
pCO2 arterial: 35.8 mmHg (ref 32–48)
pH, Arterial: 7.384 (ref 7.35–7.45)
pO2, Arterial: 63 mmHg — ABNORMAL LOW (ref 83–108)

## 2024-08-12 LAB — BLOOD GAS, VENOUS
Acid-base deficit: 1.3 mmol/L (ref 0.0–2.0)
Bicarbonate: 22.8 mmol/L (ref 20.0–28.0)
O2 Saturation: 91.9 %
Patient temperature: 36.7
pCO2, Ven: 36 mmHg — ABNORMAL LOW (ref 44–60)
pH, Ven: 7.41 (ref 7.25–7.43)
pO2, Ven: 58 mmHg — ABNORMAL HIGH (ref 32–45)

## 2024-08-12 LAB — CK: Total CK: 2740 U/L — ABNORMAL HIGH (ref 49–397)

## 2024-08-12 LAB — GLUCOSE, CAPILLARY: Glucose-Capillary: 103 mg/dL — ABNORMAL HIGH (ref 70–99)

## 2024-08-12 LAB — MRSA NEXT GEN BY PCR, NASAL: MRSA by PCR Next Gen: NOT DETECTED

## 2024-08-12 MED ORDER — CARBIDOPA-LEVODOPA 25-100 MG PO TABS
2.0000 | ORAL_TABLET | Freq: Four times a day (QID) | ORAL | Status: DC
  Filled 2024-08-12 (×4): qty 2

## 2024-08-12 MED ORDER — IPRATROPIUM-ALBUTEROL 0.5-2.5 (3) MG/3ML IN SOLN
3.0000 mL | RESPIRATORY_TRACT | Status: DC | PRN
  Administered 2024-08-12 (×2): 3 mL via RESPIRATORY_TRACT
  Filled 2024-08-12 (×2): qty 3

## 2024-08-12 MED ORDER — PANTOPRAZOLE SODIUM 40 MG PO TBEC
40.0000 mg | DELAYED_RELEASE_TABLET | Freq: Two times a day (BID) | ORAL | Status: DC
  Administered 2024-08-12: 40 mg via ORAL
  Filled 2024-08-12: qty 1

## 2024-08-12 MED ORDER — LACTATED RINGERS IV BOLUS
1000.0000 mL | Freq: Once | INTRAVENOUS | Status: AC
  Administered 2024-08-12: 1000 mL via INTRAVENOUS

## 2024-08-12 MED ORDER — DEXTROSE 50 % IV SOLN: 1.0000 | INTRAVENOUS | Status: AC | PRN

## 2024-08-12 MED ORDER — ROPINIROLE HCL 1 MG PO TABS
0.5000 mg | ORAL_TABLET | Freq: Four times a day (QID) | ORAL | Status: DC
  Administered 2024-08-12: 0.5 mg via ORAL
  Filled 2024-08-12 (×5): qty 1

## 2024-08-12 MED ORDER — GLYCOPYRROLATE 0.2 MG/ML IJ SOLN
0.1000 mg | Freq: Four times a day (QID) | INTRAMUSCULAR | Status: DC | PRN
  Administered 2024-08-12: 0.1 mg via INTRAVENOUS
  Filled 2024-08-12: qty 1

## 2024-08-12 NOTE — Plan of Care (Signed)

## 2024-08-12 NOTE — Progress Notes (Signed)
 19:15: Asked to evaluate at bedside after RN called rapid response in regards to increased WOB/secretions and desaturation to 86%. Upon arrival, patient was on 12 L [place] and saturating ~ 90%, tachypneic ~ 40's. Alert and oriented but clearly lethargic with accessory muscle use.   Physical Exam: Constitutional: ill-appearing, sitting upright in bed (no tripoding) Cardiovascular: tachycardic, regular rhythm, no JVD Pulmonary/Chest: increased WOB, crackles bilaterally, decreased air movement throughout Neurological: alert & oriented  Skin: warm and dry, no edema  RT put patient on HFNC 40 L FiO2 90% with improvement in O2 to 97%  Plan: STAT CXR, VBG. Added glycopyrrolate  and metaneb for secretions. BiPAP not ordered given marked secretions.   UPDATE @ 21:50  CXR noted: 1. Small bilateral pleural effusions with basilar and mid lung infiltrates, progressing since prior study, possibly representing developing pneumonia or edema. 2. Mild cardiac enlargement without vascular congestion.  VBG with pCO2 of 36, pH 7.41  Evaluated patient at bedside who appeared more comfortable and states he feels better. Vitals presently reassuring. Asked RN to reach out to RT about metaneb.  UPDATE @ 01:00  Vitals worsened with persistent tachypnea in 40's despite continuing above HFNC. PCCM will evaluate soon with likely ICU transfer.

## 2024-08-12 NOTE — Progress Notes (Signed)
 "  HD#1 SUBJECTIVE:  Patient Summary: Zachary Heath is a 79 y.o. male with a pertinent PMH of Parkinson's disease, CAD s/p CABG (2022), CKD3b, hypothyroidism, asthma, HTN and GERD, who presented with flu-like symptoms + fall and admitted for sepsis 2/2 PNA.   Overnight Events: No events overnight.  Interim History: Patient reports feeling improvement in fevers/chills.  Difficult to understand as patient is not enunciating clearly.  States that his Parkinson medications were changed recently.  Not had any chest pain currently.  Thinks that his swallowing is improving.  OBJECTIVE:  Vital Signs: Vitals:   08/11/24 2350 08/12/24 0345 08/12/24 0755 08/12/24 1049  BP: (!) 143/72 136/65 137/65 (!) 151/128  Pulse: 85 93 (!) 102 100  Resp: (!) 22 (!) 25  18  Temp: 98.2 F (36.8 C) 99.2 F (37.3 C) 100.1 F (37.8 C) 99.4 F (37.4 C)  TempSrc: Oral Oral Axillary Oral  SpO2: 99% 90% 93% 94%  Weight:      Height:        Filed Weights   08/10/24 1348  Weight: 68 kg    No intake or output data in the 24 hours ending 08/12/24 1220  Net IO Since Admission: 1,354.57 mL [08/12/24 1220]  Physical Exam: Constitutional: Ill-appearing elderly male in no acute distress HENT: normocephalic atraumatic, dry MM Eyes: conjunctiva non-erythematous, PERRL, no scleral icterus Cardiovascular: tachycardic with regular rhythm, no m/r/g Pulmonary/Chest: Noisily breathing.  Labored breathing on 2 LNC, tachypneic.  Gurgling on respiratory secretions, upper airway sounds present; diminished breath sounds anteriorly Abdominal: soft, non-tender, non-distended, bowel sounds normal Neurological: alert & oriented x3, mildly increased tone to bilateral upper extremities, bilateral lower extremities are rigid with significantly increased tone; bilateral clonus Skin: hot to the touch, clammy Extremities: no edema or cyanosis; peripheral pulses intact Psych: normal mood and affect, thought content  normal  Patient Lines/Drains/Airways Status     Active Line/Drains/Airways     Name Placement date Placement time Site Days   Peripheral IV 08/12/24 22 G 1.75 Anterior;Right Forearm 08/12/24  0258  Forearm  less than 1            Pertinent labs and imaging:     Latest Ref Rng & Units 08/12/2024    5:19 AM 08/11/2024    8:33 AM 08/11/2024    2:15 AM  CBC  WBC 4.0 - 10.5 K/uL 10.9   11.1   Hemoglobin 13.0 - 17.0 g/dL 83.5  82.9  84.0   Hematocrit 39.0 - 52.0 % 50.4  50.0  49.3   Platelets 150 - 400 K/uL 153   149        Latest Ref Rng & Units 08/12/2024    5:19 AM 08/11/2024    8:33 AM 08/11/2024    2:15 AM  CMP  Glucose 70 - 99 mg/dL 79   83   BUN 8 - 23 mg/dL 20   18   Creatinine 9.38 - 1.24 mg/dL 8.67   8.57   Sodium 864 - 145 mmol/L 139  135  138   Potassium 3.5 - 5.1 mmol/L 4.4  4.5  4.5   Chloride 98 - 111 mmol/L 101   99   CO2 22 - 32 mmol/L 22   23   Calcium 8.9 - 10.3 mg/dL 9.5   9.0   Total Protein 6.5 - 8.1 g/dL 6.7   6.8   Total Bilirubin 0.0 - 1.2 mg/dL 0.4   0.4   Alkaline Phos 38 - 126 U/L  56   63   AST 15 - 41 U/L 186   184   ALT 0 - 44 U/L 22   21     DG Swallowing Func-Speech Pathology Result Date: 08/12/2024 Table formatting from the original result was not included. Modified Barium Swallow Study Patient Details Name: Zachary Heath MRN: [id number] Date of Birth: 11-Oct-1945 Today's Date: 08/12/2024 HPI/PMH: HPI: 79 yo male presenting to ED 1/25 with URI symptoms and a fall. Found to be Flu A positive and CT Abdomen shows potential ileus. CXR with bilateral infrahilar opacities suspicious for infection. CT Cervical Spine negative for acute fx but shows disc space narrowing at multiple levels, most pronounced at C3-C4 and small disc osteophyte complexes at multiple levels. MBS 01/25/21 with no penetration/aspiration and mild oral deficits in addition to a suspected esophageal component with reported globus sensation. Esophagram 12/14/17 shows evidence of  prior Nissen fundoplication without evidence of recurrent hernia and although widely patent, the 13 mm barium pill would not pass through it. 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 Anterior curvature of pt's cervcial spine inhibits epiglottic inversion. This absence, in addition to partial anterior hyoid burst and incomplete laryngeal elevation, contribute to no laryngeal vestibule closure at the height of the swallow. There are residuals consistently lining pt's tongue that eventually progress posteriorly to join the significant collection already present throughout the pharynx. Residue collects most in the valleculae but is also present along the posterior pharyngeal wall and pyriform sinuses. After liquids of all consistencies accumulate in the pharynx, they spill over into the laryngeal vestibule after the swallow and continue progressing towards the vocal folds. This eventually results in trace silent aspiration (PAS 8). A cued cough is effortful but does clear a significant volume of aspirates. Pending ongoing discussions with the medical team regarding GOC, would recommend that he resume regular diet and thin liquids with ongoing education for aspiration precautions. Thickened liquids result in increased residue and are thought to be more harmful to pulmonary health if aspirated. Will continue NPO for now and f/u. DIGEST Swallow Severity Rating*  Safety: 2  Efficiency: 3  Overall Pharyngeal Swallow Severity: 3 (severe) 1: mild; 2: moderate; 3: severe; 4: profound *The Dynamic Imaging Grade of Swallowing Toxicity is standardized for the head and neck cancer population, however, demonstrates promising clinical applications across  populations to standardize the clinical rating of pharyngeal swallow safety and severity. Factors that may increase risk of adverse event in presence of aspiration [name]: Factors that may increase risk of adverse event in presence of aspiration [name]: Reduced cognitive function; Limited mobility; Frail or deconditioned Recommendations/Plan: Swallowing Evaluation Recommendations Swallowing Evaluation Recommendations Recommendations: PO diet PO Diet Recommendation: Full liquid diet Liquid Administration via: Spoon; Cup; Straw Medication Administration: Crushed with puree Supervision: Staff to assist with self-feeding; Full supervision/cueing for swallowing strategies Swallowing strategies  : Slow rate; Small bites/sips Postural changes: Position pt fully upright for meals; Stay upright 30-60 min after meals Oral care recommendations: Oral care BID (2x/day); Oral care before PO Recommended consults: Consider esophageal assessment Treatment Plan Treatment Plan Treatment recommendations: Therapy as outlined in treatment plan below Follow-up recommendations: Skilled nursing-short term rehab (<3 hours/day) Functional status assessment: Patient has had a recent decline in their functional status and demonstrates the ability  to make significant improvements in function in a reasonable and predictable amount of time. Treatment frequency: Min 2x/week Treatment duration: 2 weeks Interventions: Aspiration precaution training; Patient/family education; Diet toleration management by SLP Recommendations Recommendations for follow up therapy are one component of a multi-disciplinary discharge planning process, led by the attending physician.  Recommendations may be updated based on patient status, additional functional criteria and insurance authorization. Assessment: Orofacial Exam: Orofacial Exam Oral Cavity: Oral Hygiene: Xerostomia Oral Cavity - Dentition: Adequate natural dentition Orofacial  Anatomy: WFL Oral Motor/Sensory Function: WFL Anatomy: Anatomy: Suspected cervical osteophytes; Prominent cricopharyngeus Boluses Administered: Boluses Administered Boluses Administered: Thin liquids (Level 0); Mildly thick liquids (Level 2, nectar thick); Moderately thick liquids (Level 3, honey thick); Puree  Oral Impairment Domain: Oral Impairment Domain Lip Closure: Escape beyond mid-chin Tongue control during bolus hold: Cohesive bolus between tongue to palatal seal Bolus preparation/mastication: Timely and efficient chewing and mashing Bolus transport/lingual motion: Brisk tongue motion Oral residue: Residue collection on oral structures Location of oral residue : Tongue Initiation of pharyngeal swallow : Pyriform sinuses  Pharyngeal Impairment Domain: Pharyngeal Impairment Domain Soft palate elevation: Trace column of contrast or air between SP and PW Laryngeal elevation: Partial superior movement of thyroid cartilage/partial approximation of arytenoids to epiglottic petiole Anterior hyoid excursion: Partial anterior movement Epiglottic movement: No inversion Laryngeal vestibule closure: None, wide column air/contrast in laryngeal vestibule Pharyngeal stripping wave : Present - diminished Pharyngeal contraction (A/P view only): N/A Pharyngoesophageal segment opening: Partial distention/partial duration, partial obstruction of flow Tongue base retraction: Wide column of contrast or air between tongue base and PPW Pharyngeal residue: Majority of contrast within or on pharyngeal structures Location of pharyngeal residue: Tongue base; Valleculae; Pharyngeal wall; Pyriform sinuses; Diffuse (>3 areas)  Esophageal Impairment Domain: Esophageal Impairment Domain Esophageal clearance upright position: Esophageal retention Pill: No data recorded Penetration/Aspiration Scale Score: Penetration/Aspiration Scale Score 1.  Material does not enter airway: Puree 8.  Material enters airway, passes BELOW cords without attempt  by patient to eject out (silent aspiration) : Thin liquids (Level 0); Mildly thick liquids (Level 2, nectar thick); Moderately thick liquids (Level 3, honey thick) Compensatory Strategies: Compensatory Strategies Compensatory strategies: Yes Multiple swallows: Ineffective Ineffective Multiple Swallows: Thin liquid (Level 0); Mildly thick liquid (Level 2, nectar thick); Moderately thick liquid (Level 3, honey thick); Puree Chin tuck: Ineffective Ineffective Chin Tuck: Thin liquid (Level 0) Right head turn: Ineffective Ineffective Right Head Turn: Thin liquid (Level 0)   General Information: Caregiver present: No  Diet Prior to this Study: NPO   Temperature : Normal   Respiratory Status: WFL   Supplemental O2: Nasal cannula   History of Recent Intubation: No  Behavior/Cognition: Alert; Cooperative; Requires cueing Self-Feeding Abilities: Needs assist with self-feeding Baseline vocal quality/speech: Hypophonia/low volume Volitional Cough: Able to elicit Volitional Swallow: Able to elicit Exam Limitations: No limitations Goal Planning: Prognosis for improved oropharyngeal function: Good Barriers to Reach Goals: Time post onset No data recorded Patient/Family Stated Goal: none stated Consulted and agree with results and recommendations: Patient; Physician Pain: Pain Assessment Pain Assessment: No/denies pain Faces Pain Scale: 0 Pain Intervention(s): Monitored during session End of Session: Start Time:SLP Start Time (ACUTE ONLY): 1005 Stop Time: SLP Stop Time (ACUTE ONLY): 1025 Time Calculation:SLP Time Calculation (min) (ACUTE ONLY): 20 min Charges: SLP Evaluations $ SLP Speech Visit: 1 Visit SLP Evaluations $BSS Swallow: 1 Procedure $MBS Swallow: 1 Procedure SLP visit diagnosis: SLP Visit Diagnosis: Dysphagia, oropharyngeal phase (R13.12) Past Medical History: Past Medical History: Diagnosis Date  Barrett's esophagus   Coronary artery disease   GERD (gastroesophageal reflux disease)   Glossodynia   Hypertension    Parkinson's disease (HCC)  Past Surgical History: Past Surgical History: Procedure Laterality Date  CARDIAC CATHETERIZATION    CORONARY ARTERY BYPASS GRAFT  01/14/2021  Dr Rosalynn Dines at [facility] Damien Blumenthal, M.A., CCC-SLP Speech Language Pathology, Acute Rehabilitation Services Secure Chat preferred [phone] 08/12/2024, 11:37 AM    ASSESSMENT/PLAN:  Assessment: Principal Problem:   Acute hypoxic respiratory failure [facility]) Active Problems:   Fall   Pneumonia of right middle lobe due to infectious organism   Sepsis (HCC)   Head injury   Zachary Heath is a 79 y.o. male with a history of Parkinson's disease, CAD s/p CABG (2022), CKD3b, hypothyroidism, asthma, HTN and GERD who presented with flu-like symptoms + fall and was admitted for sepsis 2/2 PNA, now on hospital day 1.  Plan: #Sepsis 2/2 Pneumonia, suspected aspiration #Acute Hypoxic Respiratory Failure #Influenza A Patient was stable overnight with improvement of vitals, though still remains overall sick with high risk for decompensation.  No fevers overnight, still persistently tachycardic but downtrending, oxygen requirements down to 2 LNC with good saturation.  Patient reports subjective improvement of swallowing on FLD, however he was gurgling on respiratory secretions requiring deep suction.  On exam, still noisily breathing.  Lung exam limited to anterior fields as patient was weak, did not hear rhonchi from yesterday.  Main concern is that the patient will continue to aspirate and require continuous antibiotics.  Parkinson's disease is likely contributor towards progressive dysphagia, appreciate neurology assistance as below.  He was positive for flu A, receiving Tamiflu .  Continuing on ceftriaxone  and doxycycline  while pending MRSA nares. - MRSA nares pending - Continue IV ceftriaxone , pharmacy to dose - Continue IV doxycyline 100 mg  - Continue Tamiflu  30 mg bid (5 total days, loaded on 1/25) -  Mucinex  600 mg bid - Blood cultures NGTD, still pending - Continuous cardiac monitoring  #Parkinson's Disease #Dysphagia #Falls with scalp hematoma Patient still with consistent dysphagia this morning, gurgling on respiratory secretions, and concern for aspiration.  SLP evaluated the patient today with MBS which showed the patient was aspirating respiratory secretions. Able to clear with cough. Do not recommended thickened liquids, will consult palliative for further discussion regarding goals for diet/nutrition. Will keep NPO for now. In terms of Parkinson's medications, was a bit unclear as [facility] and the medication list that daughter reported does not necessarily match what patient is reporting in regards to recent changes. He takes Sinemet  25-100 2 tablets qid but daughter reported 1 tablet. Additional 50/200 dose at bedtime. Also taking Ropinirole  and Rasagaline. Patient with increased tone to all extremities, significant rigidity to bilateral lower extremities with clonus. Will consult Neurology for assistance with any necessary medication changes. Follows with Dr. Deward Moles in outpatient setting. PT/OT working with patient, recommending SNF. - NPO until GOC with SLP and appreciate palliative assistance - Consulted Neurology, appreciate assistance - Continue Sinemet  25/100 qid with additional 50/200 at bedtime until neurology recs - Continue Ropinirole  1 mg qid - Continue Rasagiline  1 mg daily - PT/OT eval + treat - Pain regimen:  - Acetaminophen  650 mg q6h PRN   #Hypertensive Urgency BP improving, down to 170s->140s systolic. Continuing home medications of amlodipine  5 mg daily and metoprolol  tartrate 25 mg bid. Holding losartan  due to concerns for renal function. - Continue PO amlodipine  5 mg daily - Continue PO metoprolol   tartrate 25 mg bid - Hold home losartan   #Elevated Troponins #CAD s/p CABG Patient again denying any active chest pain.  Initial EKG showed ST  elevations in V1/V2 but no reciprocal ST depressions.  These are chronic ST elevations seen on prior tracings.  Troponins this admission elevated at 111? 117 (flat delta).  Low concern for ACS.  Elevation in troponin likely in the setting of demand ischemia vs. CKD 3B.  Will continue home aspirin . - Continue home ASA 81 mg daily  #Rhabdomyolysis #CKD3b Follows with Dr. Chalmers and prescribed Lasix  MWF prn but patient is not taking. Cr at 1.6 on  admission but now downtrending, 1.32 today.  CK is improving to 2700s from 4670 on admission.  Will give additional liter bolus and monitor kidney function. - LR 1L bolus - Strict I's/O's - Trend CMPs + CK - Hold nephrotoxic agents - Hold home statin, losartan , empagliflozin  #Prediabetes #Hypoglycemia CBG 79 this morning.  Now that patient is n.p.o., may need additional supplementation to maintain normal glycemic range. - CBG monitoring q6h - D50 prn for hypoglycemia   #Radiographic findings of early ileus Patient denies any constipation or abdominal pain. Will start bowel regimen for him. - Miralax /Glycolax  17 g daily   #GOC Patient previously DNR/DNI but wished to change to Full Code on admission. He had capacity at the time the decision was made.  We will happily honor his wishes. Discussed with his daughter who is surprised at his change in decision. The patient does not recall prior DNR/DNI status. - Appreciate palliative assistance for GOC regarding diet   Chronic Stable Medical Conditions #GERD - continue home Protonix  40 mg bid #Hypothyroidism - continue home Levothyroxine  #HLD - hold home statin due to rhabdomyolysis  Best Practice: Diet: NPO, meds okay IVF: Fluids: LR, Rate: 1000 cc bolus  VTE: enoxaparin  (LOVENOX ) injection 40 mg Start: 08/10/24 1800 Code: Full  Disposition planning: Therapy Recs: SNF, DME: none Family Contact: [name] (daughter), at bedside. DISPO: Anticipated discharge to Skilled nursing facility  pending clinical improvement.  Signature:  Letha Cheadle, MD [facility]  PGY-1 08/12/2024, 12:20 PM  On Call pager [phone]  "

## 2024-08-12 NOTE — Progress Notes (Addendum)
 Modified Barium Swallow Study  Patient Details  Name: Zachary Heath MRN: [id number] Date of Birth: [birth year]  Today's Date: 08/12/2024  Modified Barium Swallow completed.  Full report located under Chart Review in the Imaging Section.  History of Present Illness 79 yo male presenting to ED 1/25 with URI symptoms and a fall. Found to be Flu A positive and CT Abdomen shows potential ileus. CXR with bilateral infrahilar opacities suspicious for infection. CT Cervical Spine negative for acute fx but shows disc space narrowing at multiple levels, most pronounced at C3-C4 and small disc osteophyte complexes at multiple levels. MBS 01/25/21 with no penetration/aspiration and mild oral deficits in addition to a suspected esophageal component with reported globus sensation. Esophagram 12/14/17 shows evidence of prior Nissen fundoplication without evidence of recurrent hernia and although widely patent, the 13 mm barium pill would not pass through it. PMH: Parkinson's disease, CAD s/p CABG (2022), CKD 3B, hypothyroidism, asthma, HTN, Barrett's esophagus, GERD   Clinical Impression Pt exhibits severe oropharyngeal dysphagia secondary to impaired efficiency that seems to be affected by chronic factors (suspected cervical osteophytes and prominent cricopharyngeus). Anterior curvature of pt's cervcial spine inhibits epiglottic inversion. This absence, in addition to partial anterior hyoid burst and incomplete laryngeal elevation, contribute to no laryngeal vestibule closure at the height of the swallow. There are residuals consistently lining pt's tongue that eventually progress posteriorly to join the significant collection already present throughout the pharynx. Residue collects most in the valleculae but is also present along the posterior pharyngeal wall and pyriform sinuses. After liquids of all consistencies accumulate in the pharynx, they spill over into the laryngeal vestibule after the swallow and  continue progressing towards the vocal folds. This eventually results in trace silent aspiration (PAS 8). A cued cough is effortful but does clear a significant volume of aspirates. Pending ongoing discussions with the medical team regarding GOC, would recommend that he resume regular diet and thin liquids with ongoing education for aspiration precautions. Thickened liquids result in increased residue and are thought to be more harmful to pulmonary health if aspirated. SLP will f/u.   DIGEST Swallow Severity Rating*  Safety: 2   Efficiency: 3  Overall Pharyngeal Swallow Severity: 3 (severe) 1: mild; 2: moderate; 3: severe; 4: profound  *The Dynamic Imaging Grade of Swallowing Toxicity is standardized for the head and neck cancer population, however, demonstrates promising clinical applications across populations to standardize the clinical rating of pharyngeal swallow safety and severity.  Factors that may increase risk of adverse event in presence of aspiration Zachary Heath & Zachary Heath 2021): Reduced cognitive function;Limited mobility;Frail or deconditioned  Swallow Evaluation Recommendations Medication Administration: Crushed with puree Swallowing strategies  : Slow rate;Small bites/sips Postural changes: Position pt fully upright for meals;Stay upright 30-60 min after meals Oral care recommendations: Oral care BID (2x/day);Oral care before PO    Zachary Heath, M.A., CCC-SLP Speech Language Pathology, Acute Rehabilitation Services  Secure Chat preferred [phone]  08/12/2024,11:26 AM

## 2024-08-12 NOTE — Progress Notes (Signed)
 Received pt in respiratory distress, with labored breathing, tachypneic, tachycardic, with fever, on ongoing rapid response,  pt is alert and oriented, follows command.  2030 Suppository prn were given for fever, maintained on HFNC settings. Updates MD on pt vitals.

## 2024-08-12 NOTE — Significant Event (Signed)
 Rapid Response Event Note   Reason for Call :  O2 desat into 80s  Initial Focused Assessment:  Patient is alert and oriented.  But he is also obstructing his airway. He is acute respiratory distress, Using accessory muscles, labored breathing.  Lung sounds rhonchi if moving air, diminished through out if obstructing  The back of this throat looks red but he is able to move his tongue easily Heart tones rapid He is warm and dry  BP 166/87  HR 115-135  RR 45-50  O2 sat 91% on 6L [place]   Interventions:  Repositioned, HOB elevated NT suction:  thick yellow secretions Duoneb Placed nasal trumpet NT suction again Placed on heated HFNC 40L 90%  After Nasal trumpet and heated HFNC placed patient labored breathing started to improve.  Plan of Care:  RN to call if patient's work of breathing increases, if he O2 needs increase or other respiratory distress.   Event Summary:   MD Notified: Resident team  Dr Isobel & Dr Marylu came to bedside Call Time: 1835 Arrival Time: 8161 End Time: 1945  Elvin Portland, RN

## 2024-08-12 NOTE — Progress Notes (Signed)
@  1824H Pt. is tachypneic and O2sat is 86%, BP 163/70mmHg. Pt. is becoming lethargic, Coarse crackles on bilateral uppper lungs. Oral suction done and asked patient to try to cough and expectorate secretion. Requested for a nebulizer. Requested for bedside evaluation from Dr. Koomson as patient appears having airway obstruction. Informed charged nurse and called rapid response. Also called RT and informed about the situation.

## 2024-08-12 NOTE — Consult Note (Addendum)
 Neurology Consultation Note  Consult Requested by: Dr. Francesco  Reason for Consult: PD medication management  Consult Date: 08/12/24   The history was obtained from the pt and chart.  During history and examination, all items were able to obtain unless otherwise noted.  History of Present Illness:  Zachary Heath is a 79 y.o. Hispanic male with PMH of CAD s/p CABG in 2022, PD, CKD3b, HTN, hypothyroidism admitted for flu like symptoms and fall at home. He was found to have flu A infection, pneumonia with sepsis, AKI and respiratory distress. Neurology consulted for PD medication management.   Per pt, he had long standing PD for 25 years. He has been followed in [place] neurology. Last time follow up was 06/06/24 with Dr. Dann when he and his daughter reported more gait difficulties, and increase tremor. His immediate release sinemet  25/100 increased from 1 to 2 tab Qid. He was continued on sinemet  CR 50/100 at bedtime, requip  0.5 Qid and Azilect  1 mg daily. Per patient, he has increased his immediate release sinemet  25/100 to 2 tab qid, with a little bit improvement but still has good and bad days. However, per chart, daughter reported still taking 1 tab. Daughter was not available by phone at this time. Per pt, he was managing his medication at home by himself. Pharmacist contacted for refill records.     Past Medical History:  Diagnosis Date   Barrett's esophagus    Coronary artery disease    GERD (gastroesophageal reflux disease)    Glossodynia    Hypertension    Parkinson's disease (HCC)     Past Surgical History:  Procedure Laterality Date   CARDIAC CATHETERIZATION     CORONARY ARTERY BYPASS GRAFT  01/14/2021   Dr Rosalynn Dines at [facility]    History reviewed. No pertinent family history.  Social History:  reports that he has never smoked. He has never used smokeless tobacco. He reports that he does not drink alcohol and does not use drugs.  Allergies:  Allergies[1]  Medications Ordered Prior to Encounter[2]  Review of Systems: A full ROS was attempted today and was able to be performed.  Systems assessed include - Constitutional, Eyes, HENT, Respiratory, Cardiovascular, Gastrointestinal, Genitourinary, Integument/breast, Hematologic/lymphatic, Musculoskeletal, Neurological, Behavioral/Psych, Endocrine, Allergic/Immunologic - with pertinent responses as per HPI.  Physical Examination: Temp:  [98.2 F (36.8 C)-100.1 F (37.8 C)] 99.4 F (37.4 C) (01/27 1049) Pulse Rate:  [85-120] 96 (01/27 1237) Resp:  [18-31] 18 (01/27 1049) BP: (126-172)/(64-128) 161/80 (01/27 1237) SpO2:  [84 %-99 %] 84 % (01/27 1237)  General - well nourished, well developed, in mild respiratory distress and sonorus breathing.    Ophthalmologic - fundi not visualized due to noncooperation.    Cardiovascular - regular rhythm and rate  Neuro - awake, alert, lethargic, eyes open, orientated to age, time and situation. Moderate to severe dysarthria, not sure if he knew the place. No aphasia, paucity of speech, following all simple commands. Able to name and repeat and in moderate dysarthric voice. No gaze palsy, tracking bilaterally, visual field full. No significant facial droop but seems to have chronic R nasolabial fold flattening due to ? denture. Tongue midline. Bilateral UEs 4/5, no drift. Bilaterally LEs 3/5, no drift. Sensation symmetrical bilaterally, b/l FTN intact but slow, gait not tested. No significant resting tremor seen, mildly increased muscle tone in BLEs, muscle tone normal in BUEs. No significant cogwell seen. However, BUE mild bradykinesia and BLE moderate bradykinesia present.  Data Reviewed: DG Swallowing Func-Speech Pathology Result Date: 08/12/2024 Table formatting from the original result was not included. Modified Barium Swallow Study Patient Details Name: Zachary Heath MRN: [id number] Date of Birth: 26-Aug-1945 Today's Date: 08/12/2024  HPI/PMH: HPI: 79 yo male presenting to ED 1/25 with URI symptoms and a fall. Found to be Flu A positive and CT Abdomen shows potential ileus. CXR with bilateral infrahilar opacities suspicious for infection. CT Cervical Spine negative for acute fx but shows disc space narrowing at multiple levels, most pronounced at C3-C4 and small disc osteophyte complexes at multiple levels. MBS 01/25/21 with no penetration/aspiration and mild oral deficits in addition to a suspected esophageal component with reported globus sensation. Esophagram 12/14/17 shows evidence of prior Nissen fundoplication without evidence of recurrent hernia and although widely patent, the 13 mm barium pill would not pass through it. 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 Anterior curvature of pt's cervcial spine inhibits epiglottic inversion. This absence, in addition to partial anterior hyoid burst and incomplete laryngeal elevation, contribute to no laryngeal vestibule closure at the height of the swallow. There are residuals consistently lining pt's tongue that eventually progress posteriorly to join the significant collection already present throughout the pharynx. Residue collects most in the valleculae but is also present along the posterior pharyngeal wall and pyriform sinuses. After liquids of all consistencies accumulate in the pharynx, they spill over into the laryngeal vestibule after the swallow and continue progressing towards the vocal folds. This eventually results in trace silent aspiration (PAS 8). A cued cough is effortful but does clear a significant volume of aspirates. Pending ongoing discussions with the medical team regarding GOC, would recommend that he  resume regular diet and thin liquids with ongoing education for aspiration precautions. Thickened liquids result in increased residue and are thought to be more harmful to pulmonary health if aspirated. Will continue NPO for now and f/u. DIGEST Swallow Severity Rating*  Safety: 2  Efficiency: 3  Overall Pharyngeal Swallow Severity: 3 (severe) 1: mild; 2: moderate; 3: severe; 4: profound *The Dynamic Imaging Grade of Swallowing Toxicity is standardized for the head and neck cancer population, however, demonstrates promising clinical applications across populations to standardize the clinical rating of pharyngeal swallow safety and severity. Factors that may increase risk of adverse event in presence of aspiration [name]: Factors that may increase risk of adverse event in presence of aspiration [name]: Reduced cognitive function; Limited mobility; Frail or deconditioned Recommendations/Plan: Swallowing Evaluation Recommendations Swallowing Evaluation Recommendations Recommendations: PO diet PO Diet Recommendation: Full liquid diet Liquid Administration via: Spoon; Cup; Straw Medication Administration: Crushed with puree Supervision: Staff to assist with self-feeding; Full supervision/cueing for swallowing strategies Swallowing strategies  : Slow rate; Small bites/sips Postural changes: Position pt fully upright for meals; Stay upright 30-60 min after meals Oral care recommendations: Oral care BID (2x/day); Oral care before PO Recommended consults: Consider esophageal assessment Treatment Plan Treatment Plan Treatment recommendations: Therapy as outlined in treatment plan below Follow-up recommendations: Skilled nursing-short term rehab (<3 hours/day) Functional status assessment: Patient has had a recent decline in their functional status and demonstrates the ability to make significant improvements in function in a reasonable and predictable amount of time. Treatment frequency: Min  2x/week Treatment duration: 2 weeks Interventions: Aspiration precaution training; Patient/family  education; Diet toleration management by SLP Recommendations Recommendations for follow up therapy are one component of a multi-disciplinary discharge planning process, led by the attending physician.  Recommendations may be updated based on patient status, additional functional criteria and insurance authorization. Assessment: Orofacial Exam: Orofacial Exam Oral Cavity: Oral Hygiene: Xerostomia Oral Cavity - Dentition: Adequate natural dentition Orofacial Anatomy: WFL Oral Motor/Sensory Function: WFL Anatomy: Anatomy: Suspected cervical osteophytes; Prominent cricopharyngeus Boluses Administered: Boluses Administered Boluses Administered: Thin liquids (Level 0); Mildly thick liquids (Level 2, nectar thick); Moderately thick liquids (Level 3, honey thick); Puree  Oral Impairment Domain: Oral Impairment Domain Lip Closure: Escape beyond mid-chin Tongue control during bolus hold: Cohesive bolus between tongue to palatal seal Bolus preparation/mastication: Timely and efficient chewing and mashing Bolus transport/lingual motion: Brisk tongue motion Oral residue: Residue collection on oral structures Location of oral residue : Tongue Initiation of pharyngeal swallow : Pyriform sinuses  Pharyngeal Impairment Domain: Pharyngeal Impairment Domain Soft palate elevation: Trace column of contrast or air between SP and PW Laryngeal elevation: Partial superior movement of thyroid cartilage/partial approximation of arytenoids to epiglottic petiole Anterior hyoid excursion: Partial anterior movement Epiglottic movement: No inversion Laryngeal vestibule closure: None, wide column air/contrast in laryngeal vestibule Pharyngeal stripping wave : Present - diminished Pharyngeal contraction (A/P view only): N/A Pharyngoesophageal segment opening: Partial distention/partial duration, partial obstruction of flow Tongue base retraction: Wide  column of contrast or air between tongue base and PPW Pharyngeal residue: Majority of contrast within or on pharyngeal structures Location of pharyngeal residue: Tongue base; Valleculae; Pharyngeal wall; Pyriform sinuses; Diffuse (>3 areas)  Esophageal Impairment Domain: Esophageal Impairment Domain Esophageal clearance upright position: Esophageal retention Pill: No data recorded Penetration/Aspiration Scale Score: Penetration/Aspiration Scale Score 1.  Material does not enter airway: Puree 8.  Material enters airway, passes BELOW cords without attempt by patient to eject out (silent aspiration) : Thin liquids (Level 0); Mildly thick liquids (Level 2, nectar thick); Moderately thick liquids (Level 3, honey thick) Compensatory Strategies: Compensatory Strategies Compensatory strategies: Yes Multiple swallows: Ineffective Ineffective Multiple Swallows: Thin liquid (Level 0); Mildly thick liquid (Level 2, nectar thick); Moderately thick liquid (Level 3, honey thick); Puree Chin tuck: Ineffective Ineffective Chin Tuck: Thin liquid (Level 0) Right head turn: Ineffective Ineffective Right Head Turn: Thin liquid (Level 0)   General Information: Caregiver present: No  Diet Prior to this Study: NPO   Temperature : Normal   Respiratory Status: WFL   Supplemental O2: Nasal cannula   History of Recent Intubation: No  Behavior/Cognition: Alert; Cooperative; Requires cueing Self-Feeding Abilities: Needs assist with self-feeding Baseline vocal quality/speech: Hypophonia/low volume Volitional Cough: Able to elicit Volitional Swallow: Able to elicit Exam Limitations: No limitations Goal Planning: Prognosis for improved oropharyngeal function: Good Barriers to Reach Goals: Time post onset No data recorded Patient/Family Stated Goal: none stated Consulted and agree with results and recommendations: Patient; Physician Pain: Pain Assessment Pain Assessment: No/denies pain Faces Pain Scale: 0 Pain Intervention(s): Monitored during  session End of Session: Start Time:SLP Start Time (ACUTE ONLY): 1005 Stop Time: SLP Stop Time (ACUTE ONLY): 1025 Time Calculation:SLP Time Calculation (min) (ACUTE ONLY): 20 min Charges: SLP Evaluations $ SLP Speech Visit: 1 Visit SLP Evaluations $BSS Swallow: 1 Procedure $MBS Swallow: 1 Procedure SLP visit diagnosis: SLP Visit Diagnosis: Dysphagia, oropharyngeal phase (R13.12) Past Medical History: Past Medical History: Diagnosis Date  Barrett's esophagus   Coronary artery disease   GERD (gastroesophageal reflux disease)   Glossodynia   Hypertension   Parkinson's disease (HCC)  Past  Surgical History: Past Surgical History: Procedure Laterality Date  CARDIAC CATHETERIZATION    CORONARY ARTERY BYPASS GRAFT  01/14/2021  Dr Rosalynn Dines at [facility] Damien Blumenthal, M.A., CCC-SLP Speech Language Pathology, Acute Rehabilitation Services Secure Chat preferred [phone] 08/12/2024, 11:37 AM  DG CHEST PORT 1 VIEW Result Date: 08/11/2024 EXAM: 1 VIEW(S) XRAY OF THE CHEST 08/11/2024 07:58:49 AM COMPARISON: Chest radiographs 08/10/2024 and prior. CLINICAL HISTORY: 79 year old male with hypoxemia, recent weakness, flu-like symptoms, and fall at home. FINDINGS: LUNGS AND PLEURA: Stable lung volumes. When allowing for portable technique, the lungs appear stable. Relatively symmetric bilateral infrahilar opacity now. This is suspicious for infection and consider aspiration. No consolidation. No pleural effusion. No pneumothorax. HEART AND MEDIASTINUM: Stable mediastinal contours. Prior sternotomy. BONES AND SOFT TISSUES: Stable visible bowel gas pattern. No acute osseous abnormality. IMPRESSION: 1. Bilateral infrahilar opacities suspicious for infection, consider aspiration in this setting. 2. No consolidation or pleural effusion. Electronically signed by: Helayne Hurst MD 08/11/2024 08:03 AM EST RP Workstation: [id number]   CT Cervical Spine Wo Contrast Result Date: 08/10/2024 EXAM: CT CERVICAL SPINE  WITHOUT CONTRAST 08/10/2024 03:38:05 PM TECHNIQUE: CT of the cervical spine was performed without the administration of intravenous contrast. Multiplanar reformatted images are provided for review. Automated exposure control, iterative reconstruction, and/or weight based adjustment of the mA/kV was utilized to reduce the radiation dose to as low as reasonably achievable. COMPARISON: None available. CLINICAL HISTORY: fall Fall. FINDINGS: BONES AND ALIGNMENT: No acute fracture or traumatic malalignment. DEGENERATIVE CHANGES: Disc space narrowing at multiple levels most pronounced at C3-C4. Small disc osteophyte complexes at multiple levels. No high grade osseous spinal canal stenosis. Facet arthrosis and uncovertebral hypertrophy at multiple levels. Foraminal stenosis is most pronounced at C3-C4 and C6-C7. SOFT TISSUES: No prevertebral soft tissue swelling. Atherosclerosis at the carotid bifurcations. IMPRESSION: 1. No acute cervical spine fracture or traumatic malalignment. Electronically signed by: Donnice Mania MD 08/10/2024 04:22 PM EST RP Workstation: [id number]   CT Head Wo Contrast Result Date: 08/10/2024 EXAM: CT HEAD WITHOUT CONTRAST 08/10/2024 03:38:05 PM TECHNIQUE: CT of the head was performed without the administration of intravenous contrast. Automated exposure control, iterative reconstruction, and/or weight based adjustment of the mA/kV was utilized to reduce the radiation dose to as low as reasonably achievable. COMPARISON: 04/24/2019 CLINICAL HISTORY: Patient fell. FINDINGS: BRAIN AND VENTRICLES: No acute hemorrhage. No evidence of acute infarct. No hydrocephalus. No extra-axial collection. No mass effect or midline shift. Basal ganglia calcifications. Mild chronic microvascular ischemic changes. Left lens replacement. ORBITS: No acute abnormality. SINUSES: Scattered mucosal thickening in the ethmoid sinuses and partially visualized maxillary sinuses. SOFT TISSUES AND SKULL: Small right frontal  scalp hematoma. There is additional mild soft tissue swelling in the left frontal scalp. No skull fracture. IMPRESSION: 1. No acute intracranial abnormality. 2. Small right frontal scalp hematoma and mild soft tissue swelling in the left frontal scalp. Electronically signed by: Donnice Mania MD 08/10/2024 04:18 PM EST RP Workstation: [id number]   DG Abdomen 1 View Result Date: 08/10/2024 EXAM: 1 VIEW XRAY OF THE ABDOMEN 08/10/2024 03:06:00 PM COMPARISON: Comparison with pelvis 08/10/2024. CLINICAL HISTORY: Abnormal pelvic x-ray. FINDINGS: LINES, TUBES AND DEVICES: Telemetry leads overlie the chest. BOWEL: Nonobstructive bowel gas pattern. Scattered gas and stool throughout the colon. Scattered gas-filled small bowel. No small or large bowel distention. Changes are likely due to ileus. SOFT TISSUES: No abnormal calcifications. No radiopaque stones. BONES: Severe degenerative change of the lumbar spine. Sternotomy noted. No acute fracture. LUNG BASES: Lung  bases are clear. IMPRESSION: 1. Nondilated gas filled bowel likely due to ileus. No small or large bowel distention. Electronically signed by: Elsie Gravely MD 08/10/2024 03:22 PM EST RP Workstation: [id number]   DG Pelvis Portable Result Date: 08/10/2024 EXAM: 1 or 2 VIEW(S) XRAY OF THE PELVIS 08/10/2024 02:02:00 PM COMPARISON: None available. CLINICAL HISTORY: Questionable sepsis. Evaluate for abnormality. FINDINGS: BONES AND JOINTS: No acute fracture. No malalignment. Degenerative changes of the visualized lower lumbar spine, including spondylosis and degenerative disc disease. Mild spurring of both acetabula. SOFT TISSUES: Borderline dilated loop of left abdominal small bowel at 2.9 cm diameter. IMPRESSION: 1. No acute osseous abnormality of the pelvis. 2. Borderline dilated left abdominal small-bowel loop measuring 2.9 cm, which can be seen with ileus or early obstruction; consider further evaluation with dedicated abdominal imaging if clinically  indicated. 3. Lower lumbar spondylosis and degenerative disc disease. 4. Mild degenerative spurring of both acetabula. Electronically signed by: Ryan Salvage MD 08/10/2024 02:39 PM EST RP Workstation: [id number]   DG Chest Port 1 View Result Date: 08/10/2024 EXAM: 1 VIEW(S) XRAY OF THE CHEST 08/10/2024 02:02:00 PM COMPARISON: None available. CLINICAL HISTORY: Questionable sepsis. Evaluate for abnormality. FINDINGS: LUNGS AND PLEURA: Patchy airspace opacity in the right infrahilar region. The lungs are otherwise clear. No pleural effusion. No pneumothorax. HEART AND MEDIASTINUM: No acute abnormality of the cardiac and mediastinal silhouettes. BONES AND SOFT TISSUES: Prior median sternotomy. IMPRESSION: 1. Patchy right infrahilar airspace opacity, suspicious for pneumonia. 2. Prior median sternotomy. Electronically signed by: Ryan Salvage MD 08/10/2024 02:38 PM EST RP Workstation: [id number]    Assessment: 79 y.o. male with PMH of CAD s/p CABG in 2022, PD, CKD3b, HTN, hypothyroidism admitted for flu like symptoms and fall at home. He was found to have flu A infection, pneumonia with sepsis, AKI and respiratory distress. Neurology consulted for PD medication management. Per records, his sinemet  25/100 increased from 1 to 2 tab Qid in 05/2024. He was continued on sinemet  CR 50/100 at bedtime, requip  0.5 Qid and Azilect  1 mg daily. However, it is not quite clear the exact dose he takes at home. Will need to contact daughter and pharmacy refill records to confirm. However, current on exam, pt has mild PD symptoms, seems current medication regimen with 1 tab sinemet  qid is adequate. Pt walking difficulty at this time is multifactorial including decondition from flu infection, pneumonia, sepsis, AKI and CK elevation. PT and OT recommend SNF. [facility]. Recommend to keep current regimen and wait for further information from daughter and pharmacy.    Plan: - keep current  regimen with immediate release sinemet  25/100 1 tab Qid, sinemet  CR 50/100 at bedtime, requip  0.5 Qid and Azilect  1 mg daily. - if not able to take meds PO, consider cortrak placement for po access - will wait for further information from daughter and pharmacy. - keep [facility] in 09/2024. - check orthostatic vital with PT next session - continue PT and OT - will follow  Thank you for this consultation and allowing us  to participate in the care of this patient.  Ary Cummins, MD PhD Stroke Neurology 08/12/2024 1:31 PM  ADDENDUM:  Discussed with daughter over the phone, and also checked with pharmacist for his refill record. Per daughter, pt is managing his meds independently at home and daughter thinks he is taking 2 tabs sinemet  IR qid. Also pharmacy record also indicated that he filled sinemet  IR 2 tab qid on 08/04/2024. Will  recommend to put back on sinemet  IR 2 tab qid. He is also on Requip  0.5 qid since 09/2023 per record, currently he is on 1 mg qid this admission, will switch it back. Discussed with primary team.   Neurology will sign off. Please call with questions. [facility] Dann in 09/2024. Thanks for the consult.  Ary Cummins, MD PhD Stroke Neurology 08/12/2024 1:58 PM       [1]  Allergies Allergen Reactions   Penicillin G Anaphylaxis  [2]  No current facility-administered medications on file prior to encounter.   Current Outpatient Medications on File Prior to Encounter  Medication Sig Dispense Refill   amLODipine  (NORVASC ) 5 MG tablet Take 5 mg by mouth daily.     ammonium lactate (LAC-HYDRIN) 12 % lotion Apply 1 Application topically 2 (two) times daily as needed for dry skin.     ascorbic acid (VITAMIN C) 500 MG tablet Take 500 mg by mouth daily.     aspirin  EC 81 MG tablet Take 81 mg by mouth daily. Swallow whole.     carbidopa -levodopa  (SINEMET  CR) 50-200 MG tablet Take 1 tablet by mouth at bedtime.      carbidopa -levodopa  (SINEMET  IR) 25-100 MG tablet Take 1 tablet by mouth 4 (four) times daily.      Cholecalciferol 50 MCG (2000 UT) TABS Take 2,000 Units by mouth daily.     clotrimazole (LOTRIMIN) 1 % cream Apply 1 Application topically 2 (two) times daily as needed (dry skin/eczema).     cyanocobalamin (VITAMIN B12) 500 MCG tablet Take 500 mcg by mouth daily.     empagliflozin (JARDIANCE) 25 MG TABS tablet Take 12.5 mg by mouth daily.     ferrous sulfate 325 (65 FE) MG tablet Take 325 mg by mouth daily.     hydrocortisone cream 0.5 % Apply 1 Application topically 2 (two) times daily as needed for itching (dry skin).     ipratropium (ATROVENT) 0.03 % nasal spray Place 2 sprays into both nostrils 2 (two) times daily.     levothyroxine  (SYNTHROID ) 25 MCG tablet Take 25 mcg by mouth daily before breakfast.     losartan  (COZAAR ) 25 MG tablet Take 12.5 mg by mouth daily.     metoprolol  tartrate (LOPRESSOR ) 25 MG tablet Take 25 mg by mouth 2 (two) times daily.     Multiple Vitamin (MULTIVITAMIN ADULT PO) Take 1 tablet by mouth daily.     pantoprazole  (PROTONIX ) 40 MG tablet Take 40 mg by mouth 2 (two) times daily.     rasagiline  (AZILECT ) 1 MG TABS tablet Take 1 mg by mouth daily.     rOPINIRole  (REQUIP ) 0.5 MG tablet Take 0.5 mg by mouth in the morning, at noon, in the evening, and at bedtime.     rosuvastatin (CRESTOR) 40 MG tablet Take 40 mg by mouth daily.     ZINC SULFATE PO Take 1 tablet by mouth daily.     folic acid (FOLVITE) 400 MCG tablet Take 400 mcg by mouth daily. (Patient not taking: Reported on 08/11/2024)     furosemide  (LASIX ) 40 MG tablet Take 40 mg by mouth every Monday, Wednesday, and Friday. (Patient not taking: Reported on 08/11/2024)     magnesium oxide (MAG-OX) 400 MG tablet Take 400 mg by mouth daily. (Patient not taking: Reported on 08/11/2024)

## 2024-08-13 ENCOUNTER — Inpatient Hospital Stay (HOSPITAL_COMMUNITY)

## 2024-08-13 DIAGNOSIS — N1832 Chronic kidney disease, stage 3b: Secondary | ICD-10-CM | POA: Diagnosis not present

## 2024-08-13 DIAGNOSIS — M6282 Rhabdomyolysis: Secondary | ICD-10-CM | POA: Diagnosis not present

## 2024-08-13 DIAGNOSIS — E440 Moderate protein-calorie malnutrition: Secondary | ICD-10-CM | POA: Insufficient documentation

## 2024-08-13 DIAGNOSIS — J9601 Acute respiratory failure with hypoxia: Secondary | ICD-10-CM | POA: Diagnosis not present

## 2024-08-13 DIAGNOSIS — J690 Pneumonitis due to inhalation of food and vomit: Secondary | ICD-10-CM

## 2024-08-13 DIAGNOSIS — J09X1 Influenza due to identified novel influenza A virus with pneumonia: Secondary | ICD-10-CM | POA: Diagnosis not present

## 2024-08-13 DIAGNOSIS — J101 Influenza due to other identified influenza virus with other respiratory manifestations: Secondary | ICD-10-CM

## 2024-08-13 DIAGNOSIS — G20A1 Parkinson's disease without dyskinesia, without mention of fluctuations: Secondary | ICD-10-CM | POA: Diagnosis not present

## 2024-08-13 LAB — POCT I-STAT 7, (LYTES, BLD GAS, ICA,H+H)
Acid-base deficit: 3 mmol/L — ABNORMAL HIGH (ref 0.0–2.0)
Acid-base deficit: 3 mmol/L — ABNORMAL HIGH (ref 0.0–2.0)
Bicarbonate: 23.9 mmol/L (ref 20.0–28.0)
Bicarbonate: 22.6 mmol/L (ref 20.0–28.0)
Calcium, Ion: 1.24 mmol/L (ref 1.15–1.40)
Calcium, Ion: 1.23 mmol/L (ref 1.15–1.40)
HCT: 47 % (ref 39.0–52.0)
HCT: 45 % (ref 39.0–52.0)
Hemoglobin: 16 g/dL (ref 13.0–17.0)
Hemoglobin: 15.3 g/dL (ref 13.0–17.0)
O2 Saturation: 99 %
O2 Saturation: 99 %
Patient temperature: 98.9
Patient temperature: 99.1
Potassium: 4.1 mmol/L (ref 3.5–5.1)
Potassium: 4 mmol/L (ref 3.5–5.1)
Sodium: 141 mmol/L (ref 135–145)
Sodium: 142 mmol/L (ref 135–145)
TCO2: 25 mmol/L (ref 22–32)
TCO2: 24 mmol/L (ref 22–32)
pCO2 arterial: 46.7 mmHg (ref 32–48)
pCO2 arterial: 41.8 mmHg (ref 32–48)
pH, Arterial: 7.318 — ABNORMAL LOW (ref 7.35–7.45)
pH, Arterial: 7.343 — ABNORMAL LOW (ref 7.35–7.45)
pO2, Arterial: 127 mmHg — ABNORMAL HIGH (ref 83–108)
pO2, Arterial: 123 mmHg — ABNORMAL HIGH (ref 83–108)

## 2024-08-13 LAB — CBC
HCT: 48.1 % (ref 39.0–52.0)
Hemoglobin: 15.5 g/dL (ref 13.0–17.0)
MCH: 28.8 pg (ref 26.0–34.0)
MCHC: 32.2 g/dL (ref 30.0–36.0)
MCV: 89.2 fL (ref 80.0–100.0)
Platelets: 157 10*3/uL (ref 150–400)
RBC: 5.39 MIL/uL (ref 4.22–5.81)
RDW: 14.4 % (ref 11.5–15.5)
WBC: 9 10*3/uL (ref 4.0–10.5)
nRBC: 0 % (ref 0.0–0.2)

## 2024-08-13 LAB — GLUCOSE, CAPILLARY
Glucose-Capillary: 103 mg/dL — ABNORMAL HIGH (ref 70–99)
Glucose-Capillary: 105 mg/dL — ABNORMAL HIGH (ref 70–99)
Glucose-Capillary: 108 mg/dL — ABNORMAL HIGH (ref 70–99)
Glucose-Capillary: 109 mg/dL — ABNORMAL HIGH (ref 70–99)
Glucose-Capillary: 143 mg/dL — ABNORMAL HIGH (ref 70–99)
Glucose-Capillary: 97 mg/dL (ref 70–99)

## 2024-08-13 LAB — COMPREHENSIVE METABOLIC PANEL WITH GFR
ALT: 31 U/L (ref 0–44)
AST: 130 U/L — ABNORMAL HIGH (ref 15–41)
Albumin: 2.9 g/dL — ABNORMAL LOW (ref 3.5–5.0)
Alkaline Phosphatase: 50 U/L (ref 38–126)
Anion gap: 17 — ABNORMAL HIGH (ref 5–15)
BUN: 31 mg/dL — ABNORMAL HIGH (ref 8–23)
CO2: 20 mmol/L — ABNORMAL LOW (ref 22–32)
Calcium: 9.1 mg/dL (ref 8.9–10.3)
Chloride: 105 mmol/L (ref 98–111)
Creatinine, Ser: 1.74 mg/dL — ABNORMAL HIGH (ref 0.61–1.24)
GFR, Estimated: 39 mL/min — ABNORMAL LOW
Glucose, Bld: 95 mg/dL (ref 70–99)
Potassium: 4.3 mmol/L (ref 3.5–5.1)
Sodium: 142 mmol/L (ref 135–145)
Total Bilirubin: 0.3 mg/dL (ref 0.0–1.2)
Total Protein: 6.2 g/dL — ABNORMAL LOW (ref 6.5–8.1)

## 2024-08-13 LAB — PHOSPHORUS: Phosphorus: 3.6 mg/dL (ref 2.5–4.6)

## 2024-08-13 LAB — CK: Total CK: 938 U/L — ABNORMAL HIGH (ref 49–397)

## 2024-08-13 LAB — MAGNESIUM: Magnesium: 2.2 mg/dL (ref 1.7–2.4)

## 2024-08-13 MED ORDER — FAMOTIDINE 20 MG PO TABS: 20.0000 mg | ORAL_TABLET | Freq: Every day | ORAL | Status: DC

## 2024-08-13 MED ORDER — ETOMIDATE 2 MG/ML IV SOLN
INTRAVENOUS | Status: AC
  Filled 2024-08-13: qty 10

## 2024-08-13 MED ORDER — SENNA 8.6 MG PO TABS
1.0000 | ORAL_TABLET | Freq: Two times a day (BID) | ORAL | Status: DC
  Administered 2024-08-13 – 2024-08-16 (×6): 8.6 mg
  Filled 2024-08-13 (×6): qty 1

## 2024-08-13 MED ORDER — PROSOURCE TF20 ENFIT COMPATIBL EN LIQD
60.0000 mL | Freq: Three times a day (TID) | ENTERAL | Status: AC
  Administered 2024-08-13 – 2024-08-22 (×26): 60 mL
  Filled 2024-08-13 (×25): qty 60

## 2024-08-13 MED ORDER — OSMOLITE 1.5 CAL PO LIQD
1000.0000 mL | ORAL | Status: AC
  Administered 2024-08-13 – 2024-08-21 (×7): 1000 mL
  Filled 2024-08-13: qty 1000

## 2024-08-13 MED ORDER — ROPINIROLE HCL 0.5 MG PO TABS
0.5000 mg | ORAL_TABLET | Freq: Four times a day (QID) | ORAL | Status: AC
  Administered 2024-08-13 – 2024-08-22 (×36): 0.5 mg
  Filled 2024-08-13 (×41): qty 1

## 2024-08-13 MED ORDER — NOREPINEPHRINE 4 MG/250ML-% IV SOLN
INTRAVENOUS | Status: AC
  Filled 2024-08-13: qty 250

## 2024-08-13 MED ORDER — POLYETHYLENE GLYCOL 3350 17 G PO PACK: 17.0000 g | PACK | Freq: Every day | ORAL | Status: DC

## 2024-08-13 MED ORDER — FENTANYL CITRATE (PF) 50 MCG/ML IJ SOSY: 25.0000 ug | PREFILLED_SYRINGE | Freq: Once | INTRAMUSCULAR | Status: DC

## 2024-08-13 MED ORDER — THIAMINE MONONITRATE 100 MG PO TABS
100.0000 mg | ORAL_TABLET | Freq: Every day | ORAL | Status: AC
  Administered 2024-08-13 – 2024-08-19 (×7): 100 mg
  Filled 2024-08-13 (×7): qty 1

## 2024-08-13 MED ORDER — ORAL CARE MOUTH RINSE: 15.0000 mL | OROMUCOSAL | Status: AC | PRN

## 2024-08-13 MED ORDER — FENTANYL BOLUS VIA INFUSION
25.0000 ug | INTRAVENOUS | Status: DC | PRN
  Administered 2024-08-13: 50 ug via INTRAVENOUS
  Administered 2024-08-14 – 2024-08-15 (×8): 100 ug via INTRAVENOUS

## 2024-08-13 MED ORDER — FUROSEMIDE 10 MG/ML IJ SOLN
40.0000 mg | Freq: Once | INTRAMUSCULAR | Status: AC
  Administered 2024-08-13: 40 mg via INTRAVENOUS
  Filled 2024-08-13: qty 4

## 2024-08-13 MED ORDER — ORAL CARE MOUTH RINSE
15.0000 mL | OROMUCOSAL | Status: DC
  Administered 2024-08-13 – 2024-08-16 (×38): 15 mL via OROMUCOSAL

## 2024-08-13 MED ORDER — ASPIRIN 81 MG PO CHEW
81.0000 mg | CHEWABLE_TABLET | Freq: Every day | ORAL | Status: AC
  Administered 2024-08-13 – 2024-08-22 (×10): 81 mg
  Filled 2024-08-13 (×10): qty 1

## 2024-08-13 MED ORDER — POLYETHYLENE GLYCOL 3350 17 G PO PACK
17.0000 g | PACK | Freq: Every day | ORAL | Status: DC
  Administered 2024-08-13 – 2024-08-15 (×3): 17 g
  Filled 2024-08-13 (×4): qty 1

## 2024-08-13 MED ORDER — SODIUM CHLORIDE 0.9 % IV SOLN
250.0000 mL | INTRAVENOUS | Status: AC
  Administered 2024-08-13: 250 mL via INTRAVENOUS

## 2024-08-13 MED ORDER — RASAGILINE MESYLATE 1 MG PO TABS
1.0000 mg | ORAL_TABLET | Freq: Every day | ORAL | Status: AC
  Administered 2024-08-13 – 2024-08-22 (×10): 1 mg
  Filled 2024-08-13 (×10): qty 1

## 2024-08-13 MED ORDER — OSELTAMIVIR PHOSPHATE 30 MG PO CAPS
30.0000 mg | ORAL_CAPSULE | Freq: Two times a day (BID) | ORAL | Status: DC
  Administered 2024-08-13 (×2): 30 mg
  Filled 2024-08-13 (×3): qty 1

## 2024-08-13 MED ORDER — ALBUTEROL SULFATE (2.5 MG/3ML) 0.083% IN NEBU
2.5000 mg | INHALATION_SOLUTION | Freq: Four times a day (QID) | RESPIRATORY_TRACT | Status: DC
  Administered 2024-08-13 – 2024-08-14 (×4): 2.5 mg via RESPIRATORY_TRACT
  Filled 2024-08-13 (×4): qty 3

## 2024-08-13 MED ORDER — CHLORHEXIDINE GLUCONATE CLOTH 2 % EX PADS
6.0000 | MEDICATED_PAD | Freq: Every day | CUTANEOUS | Status: AC
  Administered 2024-08-14 – 2024-08-22 (×9): 6 via TOPICAL

## 2024-08-13 MED ORDER — ARFORMOTEROL TARTRATE 15 MCG/2ML IN NEBU
15.0000 ug | INHALATION_SOLUTION | Freq: Two times a day (BID) | RESPIRATORY_TRACT | Status: DC
  Administered 2024-08-13 – 2024-08-18 (×9): 15 ug via RESPIRATORY_TRACT
  Filled 2024-08-13 (×7): qty 2

## 2024-08-13 MED ORDER — LACTATED RINGERS IV BOLUS
750.0000 mL | Freq: Once | INTRAVENOUS | Status: AC
  Administered 2024-08-13: 750 mL via INTRAVENOUS

## 2024-08-13 MED ORDER — NOREPINEPHRINE 4 MG/250ML-% IV SOLN
0.0000 ug/min | INTRAVENOUS | Status: DC
  Administered 2024-08-13: 5 ug/min via INTRAVENOUS

## 2024-08-13 MED ORDER — GUAIFENESIN 100 MG/5ML PO LIQD
15.0000 mL | Freq: Four times a day (QID) | ORAL | Status: AC
  Administered 2024-08-13 – 2024-08-22 (×37): 15 mL
  Filled 2024-08-13 (×37): qty 15

## 2024-08-13 MED ORDER — EPINEPHRINE 0.1 MG/10ML (10 MCG/ML) SYRINGE FOR IV PUSH (FOR BLOOD PRESSURE SUPPORT): 5.0000 ug | PREFILLED_SYRINGE | Freq: Once | INTRAVENOUS | Status: DC | PRN

## 2024-08-13 MED ORDER — SODIUM CHLORIDE 0.9 % IV SOLN
100.0000 mg | Freq: Two times a day (BID) | INTRAVENOUS | Status: AC
  Administered 2024-08-13 – 2024-08-16 (×7): 100 mg via INTRAVENOUS
  Filled 2024-08-13 (×7): qty 100

## 2024-08-13 MED ORDER — FENTANYL CITRATE (PF) 50 MCG/ML IJ SOSY
50.0000 ug | PREFILLED_SYRINGE | INTRAMUSCULAR | Status: DC | PRN
  Administered 2024-08-13 (×2): 50 ug via INTRAVENOUS
  Filled 2024-08-13 (×2): qty 1

## 2024-08-13 MED ORDER — ACETAMINOPHEN 650 MG RE SUPP: 650.0000 mg | Freq: Four times a day (QID) | RECTAL | Status: DC | PRN

## 2024-08-13 MED ORDER — CARBIDOPA-LEVODOPA 25-100 MG PO TABS
2.0000 | ORAL_TABLET | Freq: Four times a day (QID) | ORAL | Status: AC
  Administered 2024-08-13 – 2024-08-22 (×36): 2
  Filled 2024-08-13 (×42): qty 2

## 2024-08-13 MED ORDER — SODIUM CHLORIDE 0.9 % IV SOLN
2.0000 g | INTRAVENOUS | Status: AC
  Administered 2024-08-13 – 2024-08-16 (×4): 2 g via INTRAVENOUS
  Filled 2024-08-13 (×4): qty 20

## 2024-08-13 MED ORDER — ROCURONIUM BROMIDE 10 MG/ML (PF) SYRINGE
PREFILLED_SYRINGE | INTRAVENOUS | Status: AC
  Filled 2024-08-13: qty 10

## 2024-08-13 MED ORDER — SENNA 8.6 MG PO TABS: 1.0000 | ORAL_TABLET | Freq: Two times a day (BID) | ORAL | Status: DC

## 2024-08-13 MED ORDER — DEXMEDETOMIDINE HCL IN NACL 400 MCG/100ML IV SOLN
0.0000 ug/kg/h | INTRAVENOUS | Status: DC
  Administered 2024-08-13 (×2): 1.2 ug/kg/h via INTRAVENOUS
  Administered 2024-08-14: 1 ug/kg/h via INTRAVENOUS
  Administered 2024-08-14: 1.2 ug/kg/h via INTRAVENOUS
  Administered 2024-08-14: 1 ug/kg/h via INTRAVENOUS
  Administered 2024-08-14 – 2024-08-15 (×3): 1.2 ug/kg/h via INTRAVENOUS
  Administered 2024-08-15: 1.1 ug/kg/h via INTRAVENOUS
  Administered 2024-08-15: 1 ug/kg/h via INTRAVENOUS
  Administered 2024-08-16: 1.1 ug/kg/h via INTRAVENOUS
  Administered 2024-08-16: 1 ug/kg/h via INTRAVENOUS
  Filled 2024-08-13 (×4): qty 100
  Filled 2024-08-13: qty 200
  Filled 2024-08-13 (×3): qty 100
  Filled 2024-08-13: qty 200
  Filled 2024-08-13: qty 100

## 2024-08-13 MED ORDER — FENTANYL 2500MCG IN NS 250ML (10MCG/ML) PREMIX INFUSION
0.0000 ug/h | INTRAVENOUS | Status: DC
  Administered 2024-08-13 – 2024-08-14 (×2): 50 ug/h via INTRAVENOUS
  Administered 2024-08-15 – 2024-08-16 (×2): 150 ug/h via INTRAVENOUS
  Filled 2024-08-13 (×4): qty 250

## 2024-08-13 MED ORDER — BUDESONIDE 0.25 MG/2ML IN SUSP
0.2500 mg | Freq: Two times a day (BID) | RESPIRATORY_TRACT | Status: DC
  Administered 2024-08-13 – 2024-08-18 (×10): 0.25 mg via RESPIRATORY_TRACT
  Filled 2024-08-13 (×10): qty 2

## 2024-08-13 MED ORDER — LACTATED RINGERS IV BOLUS
500.0000 mL | Freq: Once | INTRAVENOUS | Status: AC
  Administered 2024-08-13: 500 mL via INTRAVENOUS

## 2024-08-13 MED ORDER — LEVOTHYROXINE SODIUM 25 MCG PO TABS
25.0000 ug | ORAL_TABLET | Freq: Every day | ORAL | Status: AC
  Administered 2024-08-14 – 2024-08-22 (×9): 25 ug
  Filled 2024-08-13 (×9): qty 1

## 2024-08-13 MED ORDER — PANTOPRAZOLE SODIUM 40 MG IV SOLR
40.0000 mg | Freq: Two times a day (BID) | INTRAVENOUS | Status: DC
  Administered 2024-08-13 – 2024-08-20 (×14): 40 mg via INTRAVENOUS
  Filled 2024-08-13 (×16): qty 10

## 2024-08-13 MED ORDER — DEXMEDETOMIDINE HCL IN NACL 400 MCG/100ML IV SOLN
INTRAVENOUS | Status: AC
  Administered 2024-08-13: 0.4 ug
  Filled 2024-08-13: qty 100

## 2024-08-13 MED ORDER — DEXTROSE IN LACTATED RINGERS 5 % IV SOLN: INTRAVENOUS | Status: DC

## 2024-08-13 MED ORDER — ETOMIDATE 2 MG/ML IV SOLN
20.0000 mg | Freq: Once | INTRAVENOUS | Status: AC
  Administered 2024-08-13: 20 mg via INTRAVENOUS

## 2024-08-13 MED ORDER — EPINEPHRINE 1 MG/10ML IV SOSY
PREFILLED_SYRINGE | INTRAVENOUS | Status: AC
  Filled 2024-08-13: qty 10

## 2024-08-13 MED ORDER — ACETAMINOPHEN 325 MG PO TABS
650.0000 mg | ORAL_TABLET | Freq: Four times a day (QID) | ORAL | Status: DC | PRN
  Administered 2024-08-13 – 2024-08-14 (×2): 650 mg
  Filled 2024-08-13 (×2): qty 2

## 2024-08-13 MED ORDER — FENTANYL CITRATE (PF) 50 MCG/ML IJ SOSY: 50.0000 ug | PREFILLED_SYRINGE | INTRAMUSCULAR | Status: DC | PRN

## 2024-08-13 MED ORDER — ROCURONIUM BROMIDE 10 MG/ML (PF) SYRINGE
65.0000 mg | PREFILLED_SYRINGE | Freq: Once | INTRAVENOUS | Status: AC
  Administered 2024-08-13: 65 mg via INTRAVENOUS

## 2024-08-13 MED ORDER — SODIUM CHLORIDE 3 % IN NEBU
4.0000 mL | INHALATION_SOLUTION | Freq: Two times a day (BID) | RESPIRATORY_TRACT | Status: DC
  Administered 2024-08-13 (×2): 4 mL via RESPIRATORY_TRACT
  Filled 2024-08-13 (×2): qty 4

## 2024-08-13 MED ORDER — ROCURONIUM BROMIDE 10 MG/ML (PF) SYRINGE: 100.0000 mg | PREFILLED_SYRINGE | Freq: Once | INTRAVENOUS | Status: DC

## 2024-08-13 NOTE — Progress Notes (Signed)
 RT transported patient from 3W 30 to 4N30 with no respiratory distress.

## 2024-08-13 NOTE — Plan of Care (Signed)
  Problem: Nutrition: Goal: Adequate nutrition will be maintained Outcome: Progressing   Problem: Pain Managment: Goal: General experience of comfort will improve and/or be controlled Outcome: Progressing   Problem: Respiratory: Goal: Ability to maintain a clear airway and adequate ventilation will improve Outcome: Progressing

## 2024-08-13 NOTE — Progress Notes (Signed)
 Initial Nutrition Assessment  DOCUMENTATION CODES:   Non-severe (moderate) malnutrition in context of chronic illness  INTERVENTION:   Initiate tube feeding via Cortrak/OG tube: Osmolite 1.5 at 25 ml/h and increase by 10 ml every 8 hours to goal rate of 45 ml/hr (1080 ml per day)  Prosource TF20 60 ml TID (1200, 2000, 2200)  Provides 1860 kcal, 127 gm protein, 820 ml free water  daily  100 mg thiamine  daily   Pt is at risk for refeeding syndrome given pt meets criteria for moderate malnutrition. Monitor magnesium and phosphorus daily x 4 occurrences or until WNL, MD to replete as needed.    NUTRITION DIAGNOSIS:   Moderate Malnutrition related to chronic illness as evidenced by moderate fat depletion, moderate muscle depletion, severe muscle depletion.  GOAL:   Patient will meet greater than or equal to 90% of their needs  MONITOR:   TF tolerance  REASON FOR ASSESSMENT:    (cortrak)    ASSESSMENT:   Pt with PMH of Parkinson's, CAD s/p CABG, HTN, Barrett's esophagus, former smoker, asthma, CKD 3, and GERD, lives at home with daughter admitted after being found down after 9 hours, recent poor po intake over several days admitted with flu A and PNA.   Pt discussed during ICU rounds and with RN and MD.  Pt intubated this am; 100% fiO2  Temp (24hrs), Avg:99.3 F (37.4 C), Min:98.1 F (36.7 C), Max:100.5 F (38.1 C)  Spoke with daughter who reports that pt has had weight loss recently, unsure how much. Reported UBW is 156 lb. Current weight is 143 lb. She states he is very independent and can still drive but since before Christmas he was eating less mostly due to mobility issues. She does have him a fridge and microwave in his room and he eats a lot of frozen food but she has noticed recently that he has been eating less.  She reports dysphagia he experienced prior to intubation was new and she didn't realize he was having trouble swallowing.   1/27 - seen by SLP who noted  pt with severe oropharyngeal dysphagia  1/28 - worsening respiratory distress and increased O2 requirements; required intubation  Medications reviewed and include: sinemet  IR 2 tab QID, sinemet  CR at bedtime, pepcid , synthroid , miralax , senna  Precedex   Labs reviewed:  Refeeding Labs: Recent Labs  Lab 08/13/24 0124 08/13/24 0619 08/13/24 1057 08/13/24 1154  K 4.3 4.1 4.0  --   MG  --   --   --  2.2  PHOS  --   --   --  3.6   Glucose Profile:  Recent Labs    08/13/24 0335 08/13/24 0755 08/13/24 1136  GLUCAP 97 103* 105*       NUTRITION - FOCUSED PHYSICAL EXAM:  Flowsheet Row Most Recent Value  Orbital Region Mild depletion  Upper Arm Region Moderate depletion  Thoracic and Lumbar Region Moderate depletion  Buccal Region Unable to assess  Temple Region Severe depletion  Clavicle Bone Region Severe depletion  Clavicle and Acromion Bone Region Severe depletion  Scapular Bone Region Unable to assess  Dorsal Hand Unable to assess  Patellar Region Moderate depletion  Anterior Thigh Region Moderate depletion  Posterior Calf Region Moderate depletion  Edema (RD Assessment) None  Hair Reviewed  Eyes Unable to assess  Mouth Unable to assess  Skin Reviewed  Nails Unable to assess    Diet Order:   Diet Order  Diet NPO time specified  Diet effective now                   EDUCATION NEEDS:   Education needs have been addressed  Skin:  Skin Assessment: Reviewed RN Assessment  Last BM:  1/26  Height:   Ht Readings from Last 1 Encounters:  08/13/24 5' 7 (1.702 m)    Weight:   Wt Readings from Last 1 Encounters:  08/13/24 65.3 kg    BMI:  Body mass index is 22.55 kg/m.  Estimated Nutritional Needs:   Kcal:  1800  Protein:  100-120 grams  Fluid:  > 1.8 L/day  Powell SQUIBB., RD, LDN, CNSC See AMiON for contact information

## 2024-08-13 NOTE — Progress Notes (Addendum)
 Patient seen and examined at bedside this morning after moving to 4N from IMTS to [facility] service for respiratory failure, initially requiring 100% FIO2 HHFNC. Patient positive for Influenza A, bilateral PNA with concern for aspiration PNA. Patient tachypneic and tachycardic this morning, conversation had with patient and daughter at bedside who both confirm full code, including intubation. Decision made to intubate patient for impending respiratory failure.  Patient remains on Rocephin  and Doxycycline , Tamiflu . MRSA negative 1/25. Hypertonic saline nebulizers ordered. Blood cultures negative to date. Received 40mg  IV lasix  overnight, 850cc urine output since midnight, foley catheter in place. Mild increase in creatinine. Precedex  and Fentanyl  for sedation. Sputum culture ordered. Patient mildly tachycardic, appears to take metoprolol  25mg  BID at home, will keep this on hold while requiring low dose levophed . Coretrack ordered. Daughter updated on plan of care.  Zachary Hanak, PA-C [facility] For pager details, please see AMION or use Epic chat  After 1900, please call [facility] for cross coverage needs 08/13/2024, 9:29 AM

## 2024-08-13 NOTE — Progress Notes (Signed)
 "  NAME:  Zachary Heath, MRN:  [id number], DOB:  [birth year], LOS: 2 ADMISSION DATE:  08/10/2024, CONSULTATION DATE:  08/11/24 REFERRING MD:  Dr. Francesco , CHIEF COMPLAINT:  SOB   History of Present Illness:   19 yoM with hx of former smoker, asthma, Parkinson's disease, CAD s/p CABG, CKD3b, hypothyroidism, HTN, GERD  Presented to ER 1/25 after development over last 24hrs of multiple falls at home, found down on floor for ~9hrs overnight, productive cough with yellowish sputum, fever, chills, generalized weakness, rhinorrhea.  Also recent poor PO intake over several days.  Lives with daughter.   In ER, found febrile 103 rectal, tachypneic, 95% on RA.  On workup, found to be Flu A positive and CXR with bilateral infrahilar opacities suspicious for aspiration.  Lactic reassuring, PCT 0.32, WBC 11.1, sCr 1.6> 1.42, troponin 111> 117, pBNP 2711, CK 4670.   CTH neg for acute intracranial abnormality, CT cspine neg, AXR with dilated bowel loop concerning for possible ileus vs obstruction.  Started on empiric doxycycline , ceftriaxone  and tamiflu  and admitted to IMTS.   Noted to having increasing respiratory distress requiring NTS this am therefore PCCM consulted.  More hypertensive in which IMTS is adjusting his home medications.  Remains on 3L [place] at 94-100%.  Pt currently reports he is feeling better, currently denies any pain, SOB, N/V/D, abd pain.    1/28 pccm reconsulted due to increasing sob on HHFNC 100% fio2.   Pertinent  Medical History   Past Medical History:  Diagnosis Date   Barrett's esophagus    Coronary artery disease    GERD (gastroesophageal reflux disease)    Glossodynia    Hypertension    Parkinson's disease (HCC)    Significant Hospital Events: Including procedures, antibiotic start and stop dates in addition to other pertinent events   1/25 admit ITMS 1/28 worsening respiratory distress and increase o2 requirements on HHFNC; transfer to icu  Interim History /  Subjective:  Patient w/ increasing sob; accessory muscle use HHFNC on 100% 40 l/m sats 100%  Objective    Blood pressure 127/72, pulse (!) 109, temperature 99.9 F (37.7 C), temperature source Oral, resp. rate (!) 43, height 5' 7 (1.702 m), weight 68 kg, SpO2 100%.    FiO2 (%):  [90 %-100 %] 100 %   Intake/Output Summary (Last 24 hours) at 08/13/2024 0157 Last data filed at 08/12/2024 1208 Gross per 24 hour  Intake 91.78 ml  Output --  Net 91.78 ml   Filed Weights   08/10/24 1348  Weight: 68 kg   Examination: General: critically ill appearing male w/ respiratory distress HEENT: MM pink/moist; HHFNC in place Neuro: Aox3; MAE CV: s1s2, tachy 110s, no m/r/g PULM:  dim BS bilaterally; HHFNC 100% fio2 sats 100%, tachypneic GI: soft, bsx4 active  Extremities: warm/dry, no edema  Skin: no rashes or lesions    Labs: vbg 7.41, 36, 58, 22. Imaging: CXR small b/l pleural effusions w/ basilar and mid lung infiltrates: pna or edema.   Resolved problem list   Assessment and Plan   Acute hypoxic respiratory failure Flu A positive Aspiration PNA vs viral PNA R/o dysphagia  Hx asthma GERD Parkinson's Falls w/ scalp hematoma HTN CAD s/p prior CABG Elevated Troponin Rhabdomyolysis CKD3b Prediabetes Hypoglycemia Possible ileus P:  -transfer to icu; high risk intubation -cont HHFNC and wean fio2 for sats >92% -Palliative following; previously dnr/dni and changed to full code on admission; ongoing goc  -cont tamiflu  and cap coverage -nt suction prn -pulm  toiletry -prn albuterol  -consider lasix ; monitor uop    Labs   CBC: Recent Labs  Lab 08/10/24 1350 08/11/24 0215 08/11/24 0833 08/12/24 0519 08/12/24 1935  WBC 8.2 11.1*  --  10.9*  --   NEUTROABS 7.4  --   --  9.8*  --   HGB 14.9 15.9 17.0 16.4 16.7  HCT 45.9 49.3 50.0 50.4 49.0  MCV 90.0 90.0  --  89.5  --   PLT 157 149*  --  153  --     Basic Metabolic Panel: Recent Labs  Lab 08/10/24 1350  08/11/24 0215 08/11/24 0833 08/12/24 0519 08/12/24 1935  NA 135 138 135 139 141  K 4.3 4.5 4.5 4.4 4.1  CL 100 99  --  101  --   CO2 23 23  --  22  --   GLUCOSE 135* 83  --  79  --   BUN 22 18  --  20  --   CREATININE 1.61* 1.42*  --  1.32*  --   CALCIUM 9.2 9.0  --  9.5  --    GFR: Estimated Creatinine Clearance: 41.7 mL/min (A) (by C-G formula based on SCr of 1.32 mg/dL (H)). Recent Labs  Lab 08/10/24 1350 08/10/24 1416 08/11/24 0215 08/11/24 0833 08/12/24 0519  PROCALCITON 0.32  --   --   --   --   WBC 8.2  --  11.1*  --  10.9*  LATICACIDVEN  --  1.0  --  1.6  --     Liver Function Tests: Recent Labs  Lab 08/10/24 1350 08/11/24 0215 08/12/24 0519  AST 134* 184* 186*  ALT 16 21 22   ALKPHOS 60 63 56  BILITOT 0.3 0.4 0.4  PROT 6.8 6.8 6.7  ALBUMIN 3.8 3.8 3.4*   No results for input(s): LIPASE, AMYLASE in the last 168 hours. No results for input(s): AMMONIA in the last 168 hours.  ABG    Component Value Date/Time   PHART 7.384 08/12/2024 1935   PCO2ART 35.8 08/12/2024 1935   PO2ART 63 (L) 08/12/2024 1935   HCO3 22.8 08/12/2024 2102   TCO2 22 08/12/2024 1935   ACIDBASEDEF 1.3 08/12/2024 2102   O2SAT 91.9 08/12/2024 2102     Coagulation Profile: No results for input(s): INR, PROTIME in the last 168 hours.  Cardiac Enzymes: Recent Labs  Lab 08/10/24 1350 08/12/24 0519  CKTOTAL 4,670* 2,740*    HbA1C: No results found for: HGBA1C  CBG: Recent Labs  Lab 08/11/24 0220 08/11/24 0257 08/11/24 0641 08/12/24 2337  GLUCAP 67* 185* 112* 103*     Past Medical History:  He,  has a past medical history of Barrett's esophagus, Coronary artery disease, GERD (gastroesophageal reflux disease), Glossodynia, Hypertension, and Parkinson's disease (HCC).   Surgical History:   Past Surgical History:  Procedure Laterality Date   CARDIAC CATHETERIZATION     CORONARY ARTERY BYPASS GRAFT  01/14/2021   Dr Rosalynn Dines at [facility]     Social History:   reports that he has never smoked. He has never used smokeless tobacco. He reports that he does not drink alcohol and does not use drugs.   Family History:  His family history is not on file.   Allergies Allergies[1]   Home Medications  Prior to Admission medications  Medication Sig Start Date End Date Taking? Authorizing Provider  empagliflozin (JARDIANCE) 25 MG TABS tablet Take 12.5 mg by mouth daily.   Yes [provider]  losartan  (COZAAR ) 25 MG tablet Take 12.5 mg by mouth daily.   Yes [provider]  aspirin  EC 81 MG tablet Take 162 mg by mouth daily. Swallow whole.    [provider]  Carbidopa  25 MG tablet Take 25 mg by mouth 4 (four) times daily.    [provider]  carbidopa -levodopa  (SINEMET  CR) 50-200 MG tablet Take 1 tablet by mouth at bedtime.    [provider]  carbidopa -levodopa  (SINEMET  IR) 25-100 MG tablet Take 1 tablet by mouth 4 (four) times daily.  11/19/18   [provider]  Carboxymethylcellul-Glycerin (LUBRICATING EYE DROPS OP) Place 1 drop into both eyes 2 (two) times daily.    [provider]  cetirizine (ZYRTEC) 10 MG tablet Take 10 mg by mouth daily. Patient not taking: Reported on 11/25/2021    [provider]  Cholecalciferol (VITAMIN D) 50 MCG (2000 UT) tablet Take 2,000 Units by mouth daily. Patient not taking: Reported on 11/25/2021    [provider]  ferrous sulfate 325 (65 FE) MG tablet Take 325 mg by mouth daily.    [provider]  folic acid (FOLVITE) 400 MCG tablet Take 400 mcg by mouth daily.    [provider]  furosemide  (LASIX ) 40 MG tablet Take 40 mg by mouth every other day. Patient not taking: Reported on 11/25/2021    [provider]  magnesium oxide (MAG-OX) 400 MG tablet Take 400 mg by mouth daily.    [provider]  metoprolol  tartrate (LOPRESSOR ) 50 MG tablet Take 50 mg by mouth 2 (two) times  daily.    [provider]  pantoprazole  (PROTONIX ) 40 MG tablet Take 40 mg by mouth 2 (two) times daily. 03/25/19   [provider]  rasagiline  (AZILECT ) 1 MG TABS tablet Take 1 mg by mouth daily. 04/09/19   [provider]  rOPINIRole  (REQUIP ) 1 MG tablet Take 1 mg by mouth in the morning, at noon, in the evening, and at bedtime.    [provider]  rosuvastatin (CRESTOR) 40 MG tablet Take 40 mg by mouth daily.    [provider]  zinc gluconate 50 MG tablet Take 50 mg by mouth daily.    [provider]  amLODipine  (NORVASC ) 5 MG tablet Take 1 tablet (5 mg total) by mouth daily. 04/24/19 04/24/19  Dreama Longs, MD     Critical care time: 35 minutes     JD Emilio RIGGERS [facility] 08/13/2024, 1:57 AM  Please see [email or web address] for pager details.  From 7A-7P if no response, please call [phone]. After hours, please call ELink [phone].             [1]  Allergies Allergen Reactions   Penicillin G Anaphylaxis   "

## 2024-08-13 NOTE — Progress Notes (Addendum)
 Transfer pt to 4N30 with RT, HR 105-110, no fever, lethargic but arousable, open eyes when asked. Hand off were given to RN prior transfer.

## 2024-08-13 NOTE — Progress Notes (Addendum)
 "  NAME:  Zachary Heath, MRN:  [id number], DOB:  25-Oct-1945, LOS: 2 ADMISSION DATE:  08/10/2024, CONSULTATION DATE:  08/11/24 REFERRING MD:  Dr. Francesco , CHIEF COMPLAINT:  SOB   History of Present Illness:   30 yoM with hx of former smoker, asthma, Parkinson's disease, CAD s/p CABG, CKD3b, hypothyroidism, HTN, GERD Presented to ER 1/25 after development over last 24hrs of multiple falls at home, found down on floor for ~9hrs overnight, productive cough with yellowish sputum, fever, chills, generalized weakness, rhinorrhea.  Also recent poor PO intake over several days.  Lives with daughter.  In ER, found febrile 103 rectal, tachypneic, 95% on RA.  On workup, found to be Flu A positive and CXR with bilateral infrahilar opacities suspicious for aspiration.  Lactic reassuring, PCT 0.32, WBC 11.1, sCr 1.6> 1.42, troponin 111> 117, pBNP 2711, CK 4670.   CTH neg for acute intracranial abnormality, CT cspine neg, AXR with dilated bowel loop concerning for possible ileus vs obstruction.  Started on empiric doxycycline , ceftriaxone  and tamiflu  and admitted to IMTS.   Noted to having increasing respiratory distress requiring NTS this am therefore PCCM consulted.  More hypertensive in which IMTS is adjusting his home medications.  Remains on 3L [place] at 94-100%.  Pt currently reports he is feeling better, currently denies any pain, SOB, N/V/D, abd pain.   1/28 pccm reconsulted due to increasing sob on HHFNC 100% fio2.  Pertinent  Medical History   Past Medical History:  Diagnosis Date   Barrett's esophagus    Coronary artery disease    GERD (gastroesophageal reflux disease)    Glossodynia    Hypertension    Parkinson's disease (HCC)    Significant Hospital Events: Including procedures, antibiotic start and stop dates in addition to other pertinent events   1/25 admit ITMS 1/28 worsening respiratory distress and increase o2 requirements on HHFNC; transfer to icu  Interim History / Subjective:   Patient required HHFNC 100% overnight, transferred to ICU. Patient awake and alert this morning, oriented x3. Conversation had with daughter and patient at bedside regarding current condition and concern for impending respiratory failure, need for intubation. Patient requested full code, and intubation if needed.  Objective    Blood pressure 126/67, pulse (!) 109, temperature 99.1 F (37.3 C), temperature source Oral, resp. rate (!) 22, height 5' 7 (1.702 m), weight 65.3 kg, SpO2 99%.    Vent Mode: PRVC FiO2 (%):  [50 %-100 %] 50 % Set Rate:  [22 bmp] 22 bmp Vt Set:  [530 mL] 530 mL PEEP:  [5 cmH20] 5 cmH20 Plateau Pressure:  [18 cmH20] 18 cmH20   Intake/Output Summary (Last 24 hours) at 08/13/2024 1327 Last data filed at 08/13/2024 1300 Gross per 24 hour  Intake 985.62 ml  Output 200 ml  Net 785.62 ml   Filed Weights   08/10/24 1348 08/13/24 0500  Weight: 68 kg 65.3 kg   Physical Exam Vitals and nursing note reviewed.  Constitutional:      Comments: Elderly, chronically ill appearing  HENT:     Head: Normocephalic and atraumatic.     Right Ear: External ear normal.     Left Ear: External ear normal.     Nose: Nose normal.     Mouth/Throat:     Pharynx: Oropharynx is clear.  Cardiovascular:     Rate and Rhythm: Regular rhythm. Tachycardia present.     Pulses: Normal pulses.     Heart sounds: Normal heart sounds.  Pulmonary:  Effort: Tachypnea and accessory muscle usage present.     Comments: 100% FIO2 HHFNC Abdominal:     General: Abdomen is flat.     Palpations: Abdomen is soft.  Genitourinary:    Comments: Foley  Musculoskeletal:     Right lower leg: No edema.     Left lower leg: No edema.  Skin:    General: Skin is warm and dry.  Neurological:     Mental Status: He is alert and oriented to person, place, and time. Mental status is at baseline.         Resolved problem list  Hypoglycemia  Assessment and Plan   Flu A positive Acute hypoxic  respiratory failure Aspiration PNA vs viral PNA -Intubated 1/28; Sedated with precedex  and fentanyl  -Rocephin  and Doxycyline 1/25; Tamiflu  1/25 -Hypertonic saline nebs, Chest PT -Coretrack ordered, will start tube feeds once placed  -Sputum culture ordered   Parkinson's Disease  -lives with daughter, ambulates at baseline but often gets stuck -no history of falls until date of admission -alert and oriented at baseline -Sinemet  IR   Ground level fall with prolonged downtime Traumatic Rhabdomyolysis secondary to above -CK improving, 938 -consider PT/OT consult when appropriate  CKD 3B -Cr 1.74 (baseline appears to be 1.4-1.6) -Home medication: Jardiance -Foley in place, monitor I&Os  Hx asthma GERD  HTN CAD s/p prior CABG Elevated Troponin -at home is on jardiance, lasix , aspirin , amlodipine , losartan , metoprolol , rosuvastatin  Hypothyrdoism -synthroid   Prediabetes   Critical care time: 30 minutes   [name], PA-C [facility] For pager details, please see AMION or use Epic chat  After 1900, please call [facility] for cross coverage needs 08/13/2024, 2:20 PM            "

## 2024-08-13 NOTE — Progress Notes (Signed)
 Attempted to get sputum sample, unable to get enough at this time.

## 2024-08-13 NOTE — Progress Notes (Signed)
 PT Cancellation Note  Patient Details Name: Zachary Heath MRN: [id number] DOB: 04-01-1946   Cancelled Treatment:    Reason Eval/Treat Not Completed: Medical issues which prohibited therapy; noted respiratory decline overnight and recent intubation.  Will hold on PT today and attempt another day.   Montie Portal 08/13/2024, 9:46 AM Micheline Portal, PT Acute Rehabilitation Services [phone] 08/13/2024

## 2024-08-13 NOTE — Progress Notes (Addendum)
 eLink Physician-Brief Progress Note Patient Name: Zachary Heath DOB: 19-Feb-1946 MRN: [id number]   Date of Service  08/13/2024  HPI/Events of Note  Transfer from flooor to ICU for 79 yr old male with hx of parkinson's, CAD-s/p CABG , Hypertension, Barre ts esophagus, Acute hypoxic resp failure on HHFNC Increased wob with secretions Flu A with multifocal pneumonia. On antibiotics for CAP.  S/p lasix .  Full code.  Camera: On HF nasal canula oxygen.100% fio2.  Sinus tachy 103, 146/93. Sats > 89 to 90%.   Data: Cr 1.74 7.41/36/58/22.8 Ast 130 CBC normal CK 938 Elevated pro BNP from 25 th Procal 0.32 CxR film : bi basal air space densities, mild effusion EKG sinus, basal artifact. Qtc normal. Non specific changes.   eICU Interventions  Continue current care plan Aspiration precautions Low thresh hold for intubation, if getting lethargy, co2 rising. CBG goals < 180 NPO VTE lovenox      Intervention Category Major Interventions: Respiratory failure - evaluation and management Evaluation Type: New Patient Evaluation  Zachary Heath 08/13/2024, 4:01 AM  0557 RR is increasing, pco2 earlier was < 40. [name]  RN asking if ground team can visit again to see for intubation, looks tiering out.  - get ABG stat Ground team secure chat done.

## 2024-08-13 NOTE — Progress Notes (Signed)
 Tried to call the daughter to update the transfer on 4N but it was only ringing, left message in the voice mail instead.

## 2024-08-13 NOTE — Procedures (Signed)
 Intubation Procedure Note  Zachary Heath  [id number]  11-05-1945  Date:08/13/24  Time:9:15 AM   Provider Performing:Kaia Depaolis Ruthine    Procedure: Intubation (31500)  Indication(s) Respiratory Failure  Consent Risks of the procedure as well as the alternatives and risks of each were explained to the patient and/or caregiver.  Consent for the procedure was obtained and is signed in the bedside chart   Anesthesia Etomidate  and Rocuronium    Time Out Verified patient identification, verified procedure, site/side was marked, verified correct patient position, special equipment/implants available, medications/allergies/relevant history reviewed, required imaging and test results available.   Sterile Technique Usual hand hygeine, masks, and gloves were used   Procedure Description Patient positioned in bed supine.  Sedation given as noted above.  Patient was intubated with endotracheal tube using Glidescope.  View was Grade 1 full glottis .  Number of attempts was 1.  Colorimetric CO2 detector was consistent with tracheal placement.   Complications/Tolerance None; patient tolerated the procedure well. Chest X-ray is ordered to verify placement.   EBL none   Specimen(s) None   Keven Ruthine, PA-C [facility] For pager details, please see AMION or use Epic chat  After 1900, please call [facility] for cross coverage needs 08/13/2024, 9:16 AM

## 2024-08-13 NOTE — Progress Notes (Signed)
 PCCM Interval Progress Note:   Notified earlier by nursing that patient was tachycardic 120-130bpm, it appears patient has been slightly tachycardic the past few days. Review of home medication lists metoprolol  25mg  BID which has been on hold since admission. Patient has required levophed  since intubation this morning, so will hold off on resuming metoprolol  for now until BP remains stable without levophed . A 750ml LR bolus was administered with improvement of HR.  Later on in the evening nursing reports patient has only had urine output since last night, with on bladder scan. Additional 500ml LR bolus ordered over 2 hours. Discussed with nursing if no additional urine output by end of shift to place a foley catheter.   Zachary Happ, PA-C [facility] For pager details, please see AMION or use Epic chat  After 1900, please call [facility] for cross coverage needs 08/13/2024, 4:47 PM

## 2024-08-13 NOTE — Consult Note (Signed)
 "                                                  Palliative Care Consult Note                                  Date: 08/13/2024   Patient Name: Zachary Heath  DOB: [birth year]  MRN: [id number]  Age / Sex: 79 y.o., male  PCP: Borum, Corean GRADE, MD Referring Physician: Theodoro Lakes, MD  Reason for Consultation: Establishing goals of care  HPI/Patient Profile: 79 y.o. male  with past medical history of Parkinson's disease, CAD s/p CABG (2022), CKD 3b, asthma, hypothyroidism, GERD, HTN admitted on 08/10/2024 with ***.   Past Medical History:  Diagnosis Date   Barrett's esophagus    Coronary artery disease    GERD (gastroesophageal reflux disease)    Glossodynia    Hypertension    Parkinson's disease (HCC)     Palliative Medicine has been consulted for goals of care discussions and complex medical decision making.   Clinical Assessment and Goals of Care:   Extensive chart review has been completed including labs, vital signs, imaging, progress/consult notes, orders, medications and available advance directive documents.    I met with *** to discuss diagnosis, prognosis, GOC, EOL wishes, disposition, and options.  I introduced Palliative Medicine as specialized medical care for people living with serious illness. It focuses on providing relief from the symptoms and stress of a serious illness.   Created space and opportunity for patient and family to express thoughts and feelings regarding current medical situation. Values and goals of care were attempted to be elicited.  A discussion was had today regarding advanced directives. We discussed code status and scopes of care. We discussed the difference between full scope versus limited interventions versus comfort care. The MOST form was introduced and discussed.   Life Review: ***  Functional Status: ***  Patient/Family Understanding of Illness: ***  Discussion: *** We discussed patient's current illness and what it  means in the larger context of his/her ongoing co-morbidities. Current clinical status was reviewed. Natural disease trajectory of *** was discussed.  Discussed the importance of continued conversation with the medical team regarding overall plan of care and treatment options, ensuring decisions are within the context of the patients values and GOCs.  Questions and concerns addressed. Patient/family encouraged to call with questions or concerns.   Review of Systems  Unable to perform ROS   Objective:   Primary Diagnoses: Present on Admission:  Acute hypoxic respiratory failure [facility])   Physical Exam Vitals reviewed.  Constitutional:      General: He is not in acute distress.    Appearance: He is ill-appearing.     Interventions: He is sedated.  Cardiovascular:     Rate and Rhythm: Tachycardia present.  Pulmonary:     Comments: Intubated    sodium chloride  250 mL (08/13/24 1036)   cefTRIAXone  (ROCEPHIN )  IV     dexmedetomidine  (PRECEDEX ) IV infusion 1 mcg/kg/hr (08/13/24 1042)   doxycycline  (VIBRAMYCIN ) IV     norepinephrine  (LEVOPHED ) Adult infusion Stopped (08/13/24 0855)      Vital Signs:  BP 119/60   Pulse (!) 113   Temp 99.1 F (37.3 C) (Oral)   Resp (!) 22  Ht 5' 7 (1.702 m)   Wt 65.3 kg   SpO2 99%   BMI 22.55 kg/m   Palliative Assessment/Data: ***     Assessment & Plan:   SUMMARY OF RECOMMENDATIONS   ***  Primary Decision Maker: {Primary Decision Fjxzm:78612}  Existing Vynca/ACP Documentation:   Code Status/Advance Care Planning: {Palliative Code status:23503}  Symptom Management:  ***  Prognosis:  {Palliative Care Prognosis:23504}  Discharge Planning:  {Palliative dispostion:23505}   Discussed with: ***    Thank you for allowing us  to participate in the care of Zachary Heath   Time Total: ***  Greater than 50%  of this time was spent counseling and coordinating care related to the above assessment and  plan.  Signed by: Recardo Loll, NP Palliative Medicine Team  Team Phone # [phone]  For individual providers, please see AMION                "

## 2024-08-13 NOTE — Plan of Care (Signed)
   Problem: Elimination: Goal: Will not experience complications related to urinary retention Outcome: Progressing   Problem: Safety: Goal: Ability to remain free from injury will improve Outcome: Progressing

## 2024-08-13 NOTE — Progress Notes (Signed)
 Daughter updated over phone given patient's increased wob. Recommended her to come to bedside so we can discuss GOC given patient was previously DNR/DNI and now full code.   JD Emilio RIGGERS [facility] 08/13/2024, 6:35 AM  Please see [email or web address] for pager details.  From 7A-7P if no response, please call [phone]. After hours, please call ELink [phone].

## 2024-08-14 ENCOUNTER — Inpatient Hospital Stay (HOSPITAL_COMMUNITY)

## 2024-08-14 DIAGNOSIS — G20A1 Parkinson's disease without dyskinesia, without mention of fluctuations: Secondary | ICD-10-CM | POA: Diagnosis not present

## 2024-08-14 DIAGNOSIS — M6282 Rhabdomyolysis: Secondary | ICD-10-CM | POA: Diagnosis not present

## 2024-08-14 DIAGNOSIS — J09X2 Influenza due to identified novel influenza A virus with other respiratory manifestations: Secondary | ICD-10-CM

## 2024-08-14 DIAGNOSIS — I10 Essential (primary) hypertension: Secondary | ICD-10-CM

## 2024-08-14 DIAGNOSIS — N179 Acute kidney failure, unspecified: Secondary | ICD-10-CM | POA: Diagnosis not present

## 2024-08-14 DIAGNOSIS — R7401 Elevation of levels of liver transaminase levels: Secondary | ICD-10-CM | POA: Diagnosis not present

## 2024-08-14 DIAGNOSIS — N1832 Chronic kidney disease, stage 3b: Secondary | ICD-10-CM | POA: Diagnosis not present

## 2024-08-14 DIAGNOSIS — J189 Pneumonia, unspecified organism: Secondary | ICD-10-CM

## 2024-08-14 DIAGNOSIS — J9601 Acute respiratory failure with hypoxia: Secondary | ICD-10-CM | POA: Diagnosis not present

## 2024-08-14 LAB — CBC WITH DIFFERENTIAL/PLATELET
Abs Immature Granulocytes: 0.04 10*3/uL (ref 0.00–0.07)
Basophils Absolute: 0 10*3/uL (ref 0.0–0.1)
Basophils Relative: 0 %
Eosinophils Absolute: 0 10*3/uL (ref 0.0–0.5)
Eosinophils Relative: 0 %
HCT: 44.3 % (ref 39.0–52.0)
Hemoglobin: 14.2 g/dL (ref 13.0–17.0)
Immature Granulocytes: 0 %
Lymphocytes Relative: 14 %
Lymphs Abs: 1.3 10*3/uL (ref 0.7–4.0)
MCH: 28.5 pg (ref 26.0–34.0)
MCHC: 32.1 g/dL (ref 30.0–36.0)
MCV: 88.8 fL (ref 80.0–100.0)
Monocytes Absolute: 0.8 10*3/uL (ref 0.1–1.0)
Monocytes Relative: 8 %
Neutro Abs: 7.2 10*3/uL (ref 1.7–7.7)
Neutrophils Relative %: 78 %
Platelets: 152 10*3/uL (ref 150–400)
RBC: 4.99 MIL/uL (ref 4.22–5.81)
RDW: 14.6 % (ref 11.5–15.5)
WBC: 9.3 10*3/uL (ref 4.0–10.5)
nRBC: 0.2 % (ref 0.0–0.2)

## 2024-08-14 LAB — ECHOCARDIOGRAM COMPLETE
AR max vel: 2.47 cm2
AV Area VTI: 2.77 cm2
AV Area mean vel: 2.28 cm2
AV Mean grad: 3 mmHg
AV Peak grad: 5.7 mmHg
Ao pk vel: 1.19 m/s
Area-P 1/2: 2.87 cm2
Calc EF: 50.7 %
Height: 67 in
S' Lateral: 2.5 cm
Single Plane A2C EF: 57.8 %
Single Plane A4C EF: 40.6 %
Weight: 2380.97 [oz_av]

## 2024-08-14 LAB — HEPATIC FUNCTION PANEL
ALT: 108 U/L — ABNORMAL HIGH (ref 0–44)
AST: 602 U/L — ABNORMAL HIGH (ref 15–41)
Albumin: 2.5 g/dL — ABNORMAL LOW (ref 3.5–5.0)
Alkaline Phosphatase: 178 U/L — ABNORMAL HIGH (ref 38–126)
Bilirubin, Direct: <0.1 mg/dL (ref 0.0–0.2)
Total Bilirubin: 0.3 mg/dL (ref 0.0–1.2)
Total Protein: 5.7 g/dL — ABNORMAL LOW (ref 6.5–8.1)

## 2024-08-14 LAB — GLUCOSE, CAPILLARY
Glucose-Capillary: 134 mg/dL — ABNORMAL HIGH (ref 70–99)
Glucose-Capillary: 211 mg/dL — ABNORMAL HIGH (ref 70–99)
Glucose-Capillary: 118 mg/dL — ABNORMAL HIGH (ref 70–99)
Glucose-Capillary: 116 mg/dL — ABNORMAL HIGH (ref 70–99)
Glucose-Capillary: 134 mg/dL — ABNORMAL HIGH (ref 70–99)

## 2024-08-14 LAB — COMPREHENSIVE METABOLIC PANEL WITH GFR
ALT: 96 U/L — ABNORMAL HIGH (ref 0–44)
AST: 631 U/L — ABNORMAL HIGH (ref 15–41)
Albumin: 2.8 g/dL — ABNORMAL LOW (ref 3.5–5.0)
Alkaline Phosphatase: 163 U/L — ABNORMAL HIGH (ref 38–126)
Anion gap: 11 (ref 5–15)
BUN: 50 mg/dL — ABNORMAL HIGH (ref 8–23)
CO2: 23 mmol/L (ref 22–32)
Calcium: 9.1 mg/dL (ref 8.9–10.3)
Chloride: 110 mmol/L (ref 98–111)
Creatinine, Ser: 2.66 mg/dL — ABNORMAL HIGH (ref 0.61–1.24)
GFR, Estimated: 24 mL/min — ABNORMAL LOW
Glucose, Bld: 157 mg/dL — ABNORMAL HIGH (ref 70–99)
Potassium: 3.6 mmol/L (ref 3.5–5.1)
Sodium: 144 mmol/L (ref 135–145)
Total Bilirubin: 0.3 mg/dL (ref 0.0–1.2)
Total Protein: 5.8 g/dL — ABNORMAL LOW (ref 6.5–8.1)

## 2024-08-14 LAB — PHOSPHORUS: Phosphorus: 1.8 mg/dL — ABNORMAL LOW (ref 2.5–4.6)

## 2024-08-14 LAB — URINALYSIS, DIPSTICK ONLY
Bilirubin Urine: NEGATIVE
Glucose, UA: >=500 mg/dL — AB
Ketones, ur: NEGATIVE mg/dL
Nitrite: NEGATIVE
Protein, ur: 100 mg/dL — AB
Specific Gravity, Urine: 1.011 (ref 1.005–1.030)
pH: 6 (ref 5.0–8.0)

## 2024-08-14 LAB — ACETAMINOPHEN LEVEL: Acetaminophen (Tylenol), Serum: <10 ug/mL — ABNORMAL LOW (ref 10–30)

## 2024-08-14 LAB — HEPATITIS PANEL, ACUTE
HCV Ab: NONREACTIVE
Hep A IgM: NONREACTIVE
Hep B C IgM: NONREACTIVE
Hepatitis B Surface Ag: NONREACTIVE

## 2024-08-14 LAB — MAGNESIUM: Magnesium: 2.5 mg/dL — ABNORMAL HIGH (ref 1.7–2.4)

## 2024-08-14 LAB — SODIUM, URINE, RANDOM: Sodium, Ur: 76 mmol/L

## 2024-08-14 LAB — CREATININE, URINE, RANDOM: Creatinine, Urine: 51 mg/dL

## 2024-08-14 MED ORDER — FREE WATER
200.0000 mL | Freq: Four times a day (QID) | Status: DC
  Administered 2024-08-14 – 2024-08-15 (×3): 200 mL

## 2024-08-14 MED ORDER — OSELTAMIVIR PHOSPHATE 30 MG PO CAPS
30.0000 mg | ORAL_CAPSULE | Freq: Every day | ORAL | Status: AC
  Administered 2024-08-14: 30 mg
  Filled 2024-08-14: qty 1

## 2024-08-14 MED ORDER — POTASSIUM PHOSPHATES 15 MMOLE/5ML IV SOLN
15.0000 mmol | Freq: Once | INTRAVENOUS | Status: AC
  Administered 2024-08-14: 15 mmol via INTRAVENOUS
  Filled 2024-08-14: qty 5

## 2024-08-14 MED ORDER — LACTATED RINGERS IV SOLN: INTRAVENOUS | Status: AC

## 2024-08-14 MED ORDER — ALBUTEROL SULFATE (2.5 MG/3ML) 0.083% IN NEBU
2.5000 mg | INHALATION_SOLUTION | Freq: Two times a day (BID) | RESPIRATORY_TRACT | Status: DC
  Administered 2024-08-14 – 2024-08-18 (×8): 2.5 mg via RESPIRATORY_TRACT
  Filled 2024-08-14 (×8): qty 3

## 2024-08-14 MED ORDER — AMLODIPINE BESYLATE 5 MG PO TABS
5.0000 mg | ORAL_TABLET | Freq: Every day | ORAL | Status: DC
  Administered 2024-08-14 – 2024-08-16 (×3): 5 mg
  Filled 2024-08-14 (×3): qty 1

## 2024-08-14 MED ORDER — INSULIN ASPART 100 UNIT/ML IJ SOLN
0.0000 [IU] | INTRAMUSCULAR | Status: AC
  Administered 2024-08-14: 3 [IU] via SUBCUTANEOUS
  Administered 2024-08-14 – 2024-08-15 (×3): 1 [IU] via SUBCUTANEOUS
  Administered 2024-08-15 – 2024-08-16 (×2): 2 [IU] via SUBCUTANEOUS
  Administered 2024-08-16 (×3): 1 [IU] via SUBCUTANEOUS
  Administered 2024-08-16: 2 [IU] via SUBCUTANEOUS
  Administered 2024-08-17: 1 [IU] via SUBCUTANEOUS
  Administered 2024-08-17: 2 [IU] via SUBCUTANEOUS
  Administered 2024-08-17 – 2024-08-22 (×19): 1 [IU] via SUBCUTANEOUS
  Filled 2024-08-14 (×2): qty 1
  Filled 2024-08-14: qty 2
  Filled 2024-08-14: qty 1
  Filled 2024-08-14: qty 2
  Filled 2024-08-14 (×2): qty 1
  Filled 2024-08-14: qty 2
  Filled 2024-08-14 (×8): qty 1
  Filled 2024-08-14: qty 2
  Filled 2024-08-14 (×3): qty 1
  Filled 2024-08-14: qty 2
  Filled 2024-08-14 (×4): qty 1
  Filled 2024-08-14: qty 2
  Filled 2024-08-14: qty 1
  Filled 2024-08-14: qty 2
  Filled 2024-08-14: qty 3

## 2024-08-14 MED ORDER — SODIUM CHLORIDE 3 % IN NEBU
4.0000 mL | INHALATION_SOLUTION | Freq: Two times a day (BID) | RESPIRATORY_TRACT | Status: DC
  Administered 2024-08-14 – 2024-08-18 (×9): 4 mL via RESPIRATORY_TRACT
  Filled 2024-08-14 (×9): qty 4

## 2024-08-14 MED ORDER — METOPROLOL TARTRATE 25 MG/10 ML ORAL SUSPENSION
25.0000 mg | Freq: Two times a day (BID) | ORAL | Status: AC
  Administered 2024-08-14 – 2024-08-22 (×16): 25 mg
  Filled 2024-08-14 (×17): qty 10

## 2024-08-14 MED ORDER — HEPARIN SODIUM (PORCINE) 5000 UNIT/ML IJ SOLN
5000.0000 [IU] | Freq: Three times a day (TID) | INTRAMUSCULAR | Status: AC
  Administered 2024-08-14 – 2024-08-22 (×25): 5000 [IU] via SUBCUTANEOUS
  Filled 2024-08-14 (×25): qty 1

## 2024-08-14 NOTE — Progress Notes (Signed)
 SLP Cancellation Note  Patient Details Name: Zachary Heath MRN: [id number] DOB: [birth year]   Cancelled treatment:       Reason Eval/Treat Not Completed: Medical issues which prohibited therapy (transferred to ICU and intubated). SLP will f/u as able.    Damien Blumenthal, M.A., CCC-SLP Speech Language Pathology, Acute Rehabilitation Services  Secure Chat preferred [phone]  08/14/2024, 9:05 AM

## 2024-08-14 NOTE — Progress Notes (Addendum)
 "  NAME:  Zachary Heath, MRN:  [id number], DOB:  October 27, 1945, LOS: 3 ADMISSION DATE:  08/10/2024, CONSULTATION DATE:  08/11/24 REFERRING MD:  Dr. Francesco , CHIEF COMPLAINT:  SOB   History of Present Illness:   25 yoM with hx of former smoker, asthma, Parkinson's disease, CAD s/p CABG, CKD3b, hypothyroidism, HTN, GERD Presented to ER 1/25 after development over last 24hrs of multiple falls at home, found down on floor for ~9hrs overnight, productive cough with yellowish sputum, fever, chills, generalized weakness, rhinorrhea.  Also recent poor PO intake over several days.  Lives with daughter.  In ER, found febrile 103 rectal, tachypneic, 95% on RA.  On workup, found to be Flu A positive and CXR with bilateral infrahilar opacities suspicious for aspiration.  Lactic reassuring, PCT 0.32, WBC 11.1, sCr 1.6> 1.42, troponin 111> 117, pBNP 2711, CK 4670.   CTH neg for acute intracranial abnormality, CT cspine neg, AXR with dilated bowel loop concerning for possible ileus vs obstruction.  Started on empiric doxycycline , ceftriaxone  and tamiflu  and admitted to IMTS.   Noted to having increasing respiratory distress requiring NTS this am therefore PCCM consulted.  More hypertensive in which IMTS is adjusting his home medications.  Remains on 3L [place] at 94-100%.  Pt currently reports he is feeling better, currently denies any pain, SOB, N/V/D, abd pain.   1/28 pccm reconsulted due to increasing sob on HHFNC 100% fio2.  Pertinent  Medical History   Past Medical History:  Diagnosis Date   Barrett's esophagus    Coronary artery disease    GERD (gastroesophageal reflux disease)    Glossodynia    Hypertension    Parkinson's disease (HCC)    Significant Hospital Events: Including procedures, antibiotic start and stop dates in addition to other pertinent events   1/25 admit ITMS 1/28 worsening respiratory distress and increase o2 requirements on HHFNC; transfer to icu and intubated   Interim History /  Subjective:   Patient sedated and on ventilator, febrile overnight 100.61F.   Objective    Blood pressure (!) 146/71, pulse 91, temperature 100.3 F (37.9 C), temperature source Axillary, resp. rate (!) 22, height 5' 7 (1.702 m), weight 67.5 kg, SpO2 100%.    Vent Mode: PRVC FiO2 (%):  [40 %-100 %] 40 % Set Rate:  [22 bmp] 22 bmp Vt Set:  [530 mL] 530 mL PEEP:  [5 cmH20] 5 cmH20 Pressure Support:  [12 cmH20] 12 cmH20 Plateau Pressure:  [15 cmH20-18 cmH20] 16 cmH20   Intake/Output Summary (Last 24 hours) at 08/14/2024 0848 Last data filed at 08/14/2024 0800 Gross per 24 hour  Intake 3782.27 ml  Output 1450 ml  Net 2332.27 ml   Filed Weights   08/10/24 1348 08/13/24 0500 08/14/24 0500  Weight: 68 kg 65.3 kg 67.5 kg   Physical Exam Vitals and nursing note reviewed.  Constitutional:      Comments: Elderly, chronically ill appearing  HENT:     Head: Normocephalic and atraumatic.     Right Ear: External ear normal.     Left Ear: External ear normal.     Nose: Nose normal.     Mouth/Throat:     Pharynx: Oropharynx is clear.  Cardiovascular:     Rate and Rhythm: Normal rate and regular rhythm.     Pulses: Normal pulses.     Heart sounds: Normal heart sounds.  Pulmonary:     Effort: No respiratory distress.     Breath sounds: Normal breath sounds. No wheezing.  Comments: Ventilator, FIO2 40% Abdominal:     General: Abdomen is flat.     Palpations: Abdomen is soft.  Genitourinary:    Comments: Foley  Musculoskeletal:     Right lower leg: No edema.     Left lower leg: No edema.  Skin:    General: Skin is warm and dry.  Neurological:     Mental Status: He is alert and oriented to person, place, and time. Mental status is at baseline.      Resolved problem list  Hypoglycemia  Assessment and Plan   Flu A positive Acute hypoxic respiratory failure Aspiration PNA vs viral PNA Transferred to ICU 1/28 for increased work of breathing, tachypnea, hypoxia requiring  100% HHFNC, ultimately intubated 1/28 -Sedated with precedex  and fentanyl  -Rocephin  and Doxycycline  1/25; Tamiflu  1/25 -Continue hypertonic saline nebs, chest PT -Tube feeds, add free water  200ml q6h  AKI on CKD 3B -Cr 1.32->1.74->2.66 (baseline appears to be 1.4-1.6) -Received 40mg  IV lasix  1/28 -Foley placed 1/28, monitor I&Os -Added Free water  q6h, LR 82ml/hr -Check urine creatinine, urine sodium, urinalysis  Elevated LFTs -check hepatitis panel, acetaminophen  level, liver US    Hypophosphatemia -1.8 this morning, replacement with Kphos this morning   Ground level fall with prolonged downtime Traumatic Rhabdomyolysis secondary to above -CK improving, consider PT/OT when appropriate  Parkinson's Disease  -lives with daughter, ambulates at baseline but often gets stuck -no history of falls until date of admission -alert and oriented at baseline -Sinemet  IR   Hx asthma GERD  HTN CAD s/p prior CABG Elevated Troponin -at home is on jardiance, lasix , aspirin , amlodipine , losartan , metoprolol , rosuvastatin  Hypothyrdoism -synthroid   Prediabetes   Critical care time: 32 minutes   Gerado Nabers, PA-C [facility] For pager details, please see AMION or use Epic chat  After 1900, please call ELINK for cross coverage needs 08/14/2024, 8:48 AM            "

## 2024-08-14 NOTE — Progress Notes (Signed)
 eLink Physician-Brief Progress Note Patient Name: Zachary Heath DOB: February 23, 1946 MRN: [id number]   Date of Service  08/14/2024  HPI/Events of Note  K 3.6, Phos 1.8, Cr 2.6 from 1.74. Has PIV & OGT  eICU Interventions  Ordered Kphos 15 mmol IV x 1     Intervention Category Intermediate Interventions: Electrolyte abnormality - evaluation and management  Damien ONEIDA Grout 08/14/2024, 6:24 AM

## 2024-08-14 NOTE — Progress Notes (Signed)
 Interval PCCM progress note:  Notified by RN patient is hypertensive 160-170 systolic. Patient was previously requiring levophed  yesterday post intubation, so home antihypertensives have been on hold. Levophed  has been off since yesterday afternoon, will resume amlodipine  and metoprolol  but continue to hold losartan  due to renal function. Will continue to monitor BP response.  Gelila Well, PA-C [place] [facility] For pager details, please see AMION or use Epic chat  After 1900, please call [facility] for cross coverage needs 08/14/2024, 4:14 PM

## 2024-08-14 NOTE — Progress Notes (Signed)
 PT Cancellation Note  Patient Details Name: Zachary Heath MRN: [id number] DOB: 21-Nov-1945   Cancelled Treatment:    Reason Eval/Treat Not Completed: Medical issues which prohibited therapy.  Pt is not ready for therapy today.   08/14/2024  [name], PT Acute Rehabilitation Services [phone]  (office)    [name] [name] 08/14/2024, 11:03 AM

## 2024-08-14 NOTE — Progress Notes (Signed)
 OT Cancellation Note  Patient Details Name: Zachary Heath MRN: [id number] DOB: 1946/05/22   Cancelled Treatment:     OT cancelled due to medical issues - pt intubated and sedated. Will follow up as medically appropriate.  Angeline BROCKS., OTR/L Acute Rehabilitation Services Office [phone]   Angeline M Myrka Sylva 08/14/2024, 2:30 PM
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
## 2024-08-15 ENCOUNTER — Inpatient Hospital Stay (HOSPITAL_COMMUNITY)

## 2024-08-15 DIAGNOSIS — I2510 Atherosclerotic heart disease of native coronary artery without angina pectoris: Secondary | ICD-10-CM | POA: Diagnosis not present

## 2024-08-15 DIAGNOSIS — E876 Hypokalemia: Secondary | ICD-10-CM | POA: Diagnosis not present

## 2024-08-15 DIAGNOSIS — N1832 Chronic kidney disease, stage 3b: Secondary | ICD-10-CM | POA: Diagnosis not present

## 2024-08-15 DIAGNOSIS — K219 Gastro-esophageal reflux disease without esophagitis: Secondary | ICD-10-CM | POA: Diagnosis not present

## 2024-08-15 DIAGNOSIS — E039 Hypothyroidism, unspecified: Secondary | ICD-10-CM | POA: Diagnosis not present

## 2024-08-15 DIAGNOSIS — G20C Parkinsonism, unspecified: Secondary | ICD-10-CM | POA: Diagnosis not present

## 2024-08-15 DIAGNOSIS — E870 Hyperosmolality and hypernatremia: Secondary | ICD-10-CM | POA: Diagnosis not present

## 2024-08-15 DIAGNOSIS — R7401 Elevation of levels of liver transaminase levels: Secondary | ICD-10-CM

## 2024-08-15 DIAGNOSIS — I129 Hypertensive chronic kidney disease with stage 1 through stage 4 chronic kidney disease, or unspecified chronic kidney disease: Secondary | ICD-10-CM | POA: Diagnosis not present

## 2024-08-15 DIAGNOSIS — N179 Acute kidney failure, unspecified: Secondary | ICD-10-CM | POA: Diagnosis not present

## 2024-08-15 DIAGNOSIS — R7303 Prediabetes: Secondary | ICD-10-CM

## 2024-08-15 DIAGNOSIS — J09X2 Influenza due to identified novel influenza A virus with other respiratory manifestations: Secondary | ICD-10-CM | POA: Diagnosis not present

## 2024-08-15 DIAGNOSIS — J9601 Acute respiratory failure with hypoxia: Secondary | ICD-10-CM | POA: Diagnosis not present

## 2024-08-15 DIAGNOSIS — T796XXA Traumatic ischemia of muscle, initial encounter: Secondary | ICD-10-CM

## 2024-08-15 LAB — CULTURE, BLOOD (ROUTINE X 2)
Culture: NO GROWTH
Culture: NO GROWTH
Special Requests: ADEQUATE

## 2024-08-15 LAB — GLUCOSE, CAPILLARY
Glucose-Capillary: 109 mg/dL — ABNORMAL HIGH (ref 70–99)
Glucose-Capillary: 120 mg/dL — ABNORMAL HIGH (ref 70–99)
Glucose-Capillary: 130 mg/dL — ABNORMAL HIGH (ref 70–99)
Glucose-Capillary: 150 mg/dL — ABNORMAL HIGH (ref 70–99)
Glucose-Capillary: 195 mg/dL — ABNORMAL HIGH (ref 70–99)
Glucose-Capillary: 89 mg/dL (ref 70–99)

## 2024-08-15 LAB — COMPREHENSIVE METABOLIC PANEL WITH GFR
ALT: 99 U/L — ABNORMAL HIGH (ref 0–44)
AST: 261 U/L — ABNORMAL HIGH (ref 15–41)
Albumin: 2.7 g/dL — ABNORMAL LOW (ref 3.5–5.0)
Alkaline Phosphatase: 196 U/L — ABNORMAL HIGH (ref 38–126)
Anion gap: 12 (ref 5–15)
BUN: 51 mg/dL — ABNORMAL HIGH (ref 8–23)
CO2: 26 mmol/L (ref 22–32)
Calcium: 9 mg/dL (ref 8.9–10.3)
Chloride: 112 mmol/L — ABNORMAL HIGH (ref 98–111)
Creatinine, Ser: 1.96 mg/dL — ABNORMAL HIGH (ref 0.61–1.24)
GFR, Estimated: 34 mL/min — ABNORMAL LOW
Glucose, Bld: 124 mg/dL — ABNORMAL HIGH (ref 70–99)
Potassium: 3.2 mmol/L — ABNORMAL LOW (ref 3.5–5.1)
Sodium: 150 mmol/L — ABNORMAL HIGH (ref 135–145)
Total Bilirubin: 0.4 mg/dL (ref 0.0–1.2)
Total Protein: 6.2 g/dL — ABNORMAL LOW (ref 6.5–8.1)

## 2024-08-15 LAB — FERRITIN: Ferritin: 5570 ng/mL — ABNORMAL HIGH (ref 24–336)

## 2024-08-15 LAB — UREA NITROGEN, URINE: Urea Nitrogen, Ur: 593 mg/dL

## 2024-08-15 LAB — MAGNESIUM: Magnesium: 2.1 mg/dL (ref 1.7–2.4)

## 2024-08-15 LAB — PHOSPHORUS: Phosphorus: 2.3 mg/dL — ABNORMAL LOW (ref 2.5–4.6)

## 2024-08-15 LAB — GAMMA GT: GGT: 241 U/L — ABNORMAL HIGH (ref 7–50)

## 2024-08-15 MED ORDER — LACTATED RINGERS IV SOLN: INTRAVENOUS | Status: AC

## 2024-08-15 MED ORDER — PROPOFOL 1000 MG/100ML IV EMUL: 0.0000 ug/kg/min | INTRAVENOUS | Status: DC

## 2024-08-15 MED ORDER — FREE WATER
300.0000 mL | Status: DC
  Administered 2024-08-15 – 2024-08-16 (×6): 300 mL

## 2024-08-15 MED ORDER — POTASSIUM CHLORIDE 20 MEQ PO PACK
40.0000 meq | PACK | ORAL | Status: AC
  Administered 2024-08-15 (×2): 40 meq
  Filled 2024-08-15 (×2): qty 2

## 2024-08-15 MED ORDER — METHYLPREDNISOLONE SODIUM SUCC 125 MG IJ SOLR
60.0000 mg | Freq: Once | INTRAMUSCULAR | Status: AC
  Administered 2024-08-15: 60 mg via INTRAVENOUS
  Filled 2024-08-15: qty 2

## 2024-08-15 MED ORDER — METHYLPREDNISOLONE SODIUM SUCC 40 MG IJ SOLR
40.0000 mg | Freq: Once | INTRAMUSCULAR | Status: AC
  Administered 2024-08-16: 40 mg via INTRAVENOUS
  Filled 2024-08-15: qty 1

## 2024-08-15 MED ORDER — FREE WATER: 200.0000 mL | Status: DC

## 2024-08-15 MED ORDER — PROPOFOL 1000 MG/100ML IV EMUL
INTRAVENOUS | Status: AC
  Administered 2024-08-15: 5 ug/kg/min via INTRAVENOUS
  Filled 2024-08-15: qty 100

## 2024-08-15 NOTE — Progress Notes (Signed)
 08/15/2024 He's pretty agitated query steroid effect. Understand likely extubation in am. Fine for prop + precedex +  fent; all should be pretty short acting.  Rolan Sharps MD PCCM

## 2024-08-15 NOTE — Progress Notes (Signed)
 RT and RN at bedside for patient extubation. Upon checking for cuff leak prior to extubation, no cuff leak was present. MD at bedside to witness absence of cuff leak. Per MD verbal order, holding off on extubation until tomorrow. Patient placed back on full support vent settings at this time.

## 2024-08-15 NOTE — Progress Notes (Signed)
 PCCM interval progress note:   Patient has been awake and following commands today with some intermittent agitation with the ET tube in, however at attempt to extubate patient RT noticed there was no cuff leak. Attending aware, decision made to hold off on extubation and give 60mg  solumedrol now and 40mg  solumedrol tomorrow and attempt to extubate tomorrow. Repeat CXR ordered this afternoon. Daughter updated on plan.  Salia Cangemi, PA-C  Pulmonary & Critical Care Medicine For pager details, please see AMION or use Epic chat  After 1900, please call [facility] for cross coverage needs 08/15/2024, 3:27 PM

## 2024-08-15 NOTE — Progress Notes (Signed)
 08/15/2024 Restraints renewed to prevent self harm.  Rolan Sharps MD PCCM

## 2024-08-15 NOTE — Progress Notes (Addendum)
 "  NAME:  Zachary Heath, MRN:  [id number], DOB:  1946-06-30, LOS: 4 ADMISSION DATE:  08/10/2024, CONSULTATION DATE:  08/11/24 REFERRING MD:  Dr. Francesco , CHIEF COMPLAINT:  SOB   History of Present Illness:   12 yoM with hx of former smoker, asthma, Parkinson's disease, CAD s/p CABG, CKD3b, hypothyroidism, HTN, GERD Presented to ER 1/25 after development over last 24hrs of multiple falls at home, found down on floor for ~9hrs overnight, productive cough with yellowish sputum, fever, chills, generalized weakness, rhinorrhea.  Also recent poor PO intake over several days.  Lives with daughter.  In ER, found febrile 103 rectal, tachypneic, 95% on RA.  On workup, found to be Flu A positive and CXR with bilateral infrahilar opacities suspicious for aspiration.  Lactic reassuring, PCT 0.32, WBC 11.1, sCr 1.6> 1.42, troponin 111> 117, pBNP 2711, CK 4670.   CTH neg for acute intracranial abnormality, CT cspine neg, AXR with dilated bowel loop concerning for possible ileus vs obstruction.  Started on empiric doxycycline , ceftriaxone  and tamiflu  and admitted to IMTS.   Noted to having increasing respiratory distress requiring NTS this am therefore PCCM consulted.  More hypertensive in which IMTS is adjusting his home medications.  Remains on 3L [place] at 94-100%.  Pt currently reports he is feeling better, currently denies any pain, SOB, N/V/D, abd pain.   1/28 pccm reconsulted due to increasing sob on HHFNC 100% fio2.  Pertinent  Medical History   Past Medical History:  Diagnosis Date   Barrett's esophagus    Coronary artery disease    GERD (gastroesophageal reflux disease)    Glossodynia    Hypertension    Parkinson's disease (HCC)    Significant Hospital Events: Including procedures, antibiotic start and stop dates in addition to other pertinent events   1/25 admit ITMS 1/28 worsening respiratory distress and increase o2 requirements on HHFNC; transfer to icu and intubated   Interim History /  Subjective:   Patient is awake and alert this morning on decreased sedation for plan to switch to pressure support. Patient follows commands. Febrile 100.5 earlier this morning.   Objective    Blood pressure (!) 149/68, pulse 75, temperature 98.9 F (37.2 C), temperature source Axillary, resp. rate (!) 22, height 5' 7 (1.702 m), weight 70.5 kg, SpO2 96%.    Vent Mode: PSV;CPAP FiO2 (%):  [40 %] 40 % Set Rate:  [22 bmp] 22 bmp Vt Set:  [530 mL] 530 mL PEEP:  [5 cmH20] 5 cmH20 Pressure Support:  [8 cmH20] 8 cmH20 Plateau Pressure:  [15 cmH20-21 cmH20] 21 cmH20   Intake/Output Summary (Last 24 hours) at 08/15/2024 0917 Last data filed at 08/15/2024 0622 Gross per 24 hour  Intake 4406.24 ml  Output 2650 ml  Net 1756.24 ml   Filed Weights   08/13/24 0500 08/14/24 0500 08/15/24 0500  Weight: 65.3 kg 67.5 kg 70.5 kg   Physical Exam Vitals and nursing note reviewed.  Constitutional:      Comments: Elderly, chronically ill appearing  HENT:     Head: Normocephalic and atraumatic.     Comments: Intubated     Right Ear: External ear normal.     Left Ear: External ear normal.     Nose: Nose normal.     Mouth/Throat:     Pharynx: Oropharynx is clear.  Cardiovascular:     Rate and Rhythm: Regular rhythm. Tachycardia present.     Pulses: Normal pulses.     Heart sounds: Normal heart sounds.  Pulmonary:     Comments: Ventilator, FIO2 40% Abdominal:     General: Abdomen is flat.     Palpations: Abdomen is soft.  Genitourinary:    Comments: Foley  Musculoskeletal:     Right lower leg: No edema.     Left lower leg: No edema.  Skin:    General: Skin is warm and dry.  Neurological:     Mental Status: He is alert.     Comments: Awake and alert this morning, following commands on decreased sedation       Resolved problem list  Hypoglycemia  Assessment and Plan   Flu A positive Acute hypoxic respiratory failure Aspiration PNA vs viral PNA Transferred to ICU 1/28 for  increased work of breathing, tachypnea, hypoxia requiring 100% HHFNC, ultimately intubated 1/28. Respiratory culture without growth, blood cultures negative to date.  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 [employer], stopped drinking > 30 years ago per daughter -Hepatitis panel negative -US : nodular liver suggestive of cirrhosis, mild gallbladder wall thickening with small amount of pericholecystic fluid  Mild Hypokalemia -3.2, x2 doses today   Hypophosphatemia -2.3 s/p replacement    Ground level fall with prolonged downtime Traumatic Rhabdomyolysis secondary to above -CK improving, consider PT/OT when appropriate  Parkinson's Disease  -lives with daughter, ambulates at baseline but often gets stuck -no history of falls until date of admission -alert and oriented at baseline -Sinemet  IR   Hx asthma GERD  HTN CAD s/p prior CABG Elevated Troponin -at home is on jardiance, lasix , aspirin , amlodipine , losartan , metoprolol , rosuvastatin   Hypothyrdoism -synthroid   Prediabetes   Critical care time: 30 minutes   Keven Fila, PA-C [facility] For pager details, please see AMION or use Epic chat  After 1900, please call [facility] for cross coverage needs 08/15/2024, 9:17 AM            "

## 2024-08-15 NOTE — Progress Notes (Signed)
" °   08/15/24 0855  Adult Ventilator Settings  Vent Mode PSV;CPAP  FiO2 (%) 40 %  Pressure Support (S)  8 cmH20  PEEP 5 cmH20    "

## 2024-08-15 NOTE — Progress Notes (Signed)
 Cortrak Tube Team Note:  Consult received to place a Cortrak feeding tube. NGT currently in place, per RN difficultly placing NG previously. Cortrak placement unsuccessful. NGT left in place. Cortrak tube and stylet left above head of bed in case attempt at placement is desired at after date.  Olivia Kenning, RD Registered Dietitian  See Amion for more information

## 2024-08-16 ENCOUNTER — Inpatient Hospital Stay (HOSPITAL_COMMUNITY)

## 2024-08-16 DIAGNOSIS — K7030 Alcoholic cirrhosis of liver without ascites: Secondary | ICD-10-CM

## 2024-08-16 DIAGNOSIS — W19XXXA Unspecified fall, initial encounter: Secondary | ICD-10-CM | POA: Diagnosis not present

## 2024-08-16 DIAGNOSIS — E876 Hypokalemia: Secondary | ICD-10-CM | POA: Diagnosis not present

## 2024-08-16 DIAGNOSIS — T796XXA Traumatic ischemia of muscle, initial encounter: Secondary | ICD-10-CM | POA: Diagnosis not present

## 2024-08-16 DIAGNOSIS — N179 Acute kidney failure, unspecified: Secondary | ICD-10-CM | POA: Diagnosis not present

## 2024-08-16 DIAGNOSIS — J9601 Acute respiratory failure with hypoxia: Secondary | ICD-10-CM | POA: Diagnosis not present

## 2024-08-16 DIAGNOSIS — E870 Hyperosmolality and hypernatremia: Secondary | ICD-10-CM | POA: Diagnosis not present

## 2024-08-16 DIAGNOSIS — N1832 Chronic kidney disease, stage 3b: Secondary | ICD-10-CM | POA: Diagnosis not present

## 2024-08-16 DIAGNOSIS — J09X1 Influenza due to identified novel influenza A virus with pneumonia: Secondary | ICD-10-CM | POA: Diagnosis not present

## 2024-08-16 DIAGNOSIS — R7989 Other specified abnormal findings of blood chemistry: Secondary | ICD-10-CM | POA: Diagnosis not present

## 2024-08-16 DIAGNOSIS — I10 Essential (primary) hypertension: Secondary | ICD-10-CM | POA: Diagnosis not present

## 2024-08-16 LAB — MAGNESIUM: Magnesium: 2.1 mg/dL (ref 1.7–2.4)

## 2024-08-16 LAB — COMPREHENSIVE METABOLIC PANEL WITH GFR
ALT: 113 U/L — ABNORMAL HIGH (ref 0–44)
AST: 149 U/L — ABNORMAL HIGH (ref 15–41)
Albumin: 2.6 g/dL — ABNORMAL LOW (ref 3.5–5.0)
Alkaline Phosphatase: 219 U/L — ABNORMAL HIGH (ref 38–126)
Anion gap: 11 (ref 5–15)
BUN: 58 mg/dL — ABNORMAL HIGH (ref 8–23)
CO2: 21 mmol/L — ABNORMAL LOW (ref 22–32)
Calcium: 8.6 mg/dL — ABNORMAL LOW (ref 8.9–10.3)
Chloride: 115 mmol/L — ABNORMAL HIGH (ref 98–111)
Creatinine, Ser: 1.66 mg/dL — ABNORMAL HIGH (ref 0.61–1.24)
GFR, Estimated: 41 mL/min — ABNORMAL LOW
Glucose, Bld: 203 mg/dL — ABNORMAL HIGH (ref 70–99)
Potassium: 4.8 mmol/L (ref 3.5–5.1)
Sodium: 148 mmol/L — ABNORMAL HIGH (ref 135–145)
Total Bilirubin: 0.3 mg/dL (ref 0.0–1.2)
Total Protein: 5.9 g/dL — ABNORMAL LOW (ref 6.5–8.1)

## 2024-08-16 LAB — CULTURE, RESPIRATORY W GRAM STAIN

## 2024-08-16 LAB — CBC
HCT: 42.5 % (ref 39.0–52.0)
Hemoglobin: 13.9 g/dL (ref 13.0–17.0)
MCH: 29.1 pg (ref 26.0–34.0)
MCHC: 32.7 g/dL (ref 30.0–36.0)
MCV: 88.9 fL (ref 80.0–100.0)
Platelets: 211 10*3/uL (ref 150–400)
RBC: 4.78 MIL/uL (ref 4.22–5.81)
RDW: 15 % (ref 11.5–15.5)
WBC: 5.3 10*3/uL (ref 4.0–10.5)
nRBC: 0 % (ref 0.0–0.2)

## 2024-08-16 LAB — GLUCOSE, CAPILLARY
Glucose-Capillary: 115 mg/dL — ABNORMAL HIGH (ref 70–99)
Glucose-Capillary: 139 mg/dL — ABNORMAL HIGH (ref 70–99)
Glucose-Capillary: 146 mg/dL — ABNORMAL HIGH (ref 70–99)
Glucose-Capillary: 150 mg/dL — ABNORMAL HIGH (ref 70–99)
Glucose-Capillary: 163 mg/dL — ABNORMAL HIGH (ref 70–99)
Glucose-Capillary: 171 mg/dL — ABNORMAL HIGH (ref 70–99)
Glucose-Capillary: 183 mg/dL — ABNORMAL HIGH (ref 70–99)

## 2024-08-16 LAB — PHOSPHORUS: Phosphorus: 1.8 mg/dL — ABNORMAL LOW (ref 2.5–4.6)

## 2024-08-16 LAB — CERULOPLASMIN: Ceruloplasmin: 23 mg/dL (ref 16.0–31.0)

## 2024-08-16 LAB — TRIGLYCERIDES: Triglycerides: 109 mg/dL

## 2024-08-16 MED ORDER — SODIUM CHLORIDE 0.9 % IV SOLN: INTRAVENOUS | Status: AC | PRN

## 2024-08-16 MED ORDER — LACTATED RINGERS IV SOLN: INTRAVENOUS | Status: AC

## 2024-08-16 MED ORDER — STERILE WATER FOR INJECTION IJ SOLN
INTRAMUSCULAR | Status: AC
  Administered 2024-08-16: 10 mL
  Filled 2024-08-16: qty 10

## 2024-08-16 MED ORDER — ORAL CARE MOUTH RINSE
15.0000 mL | OROMUCOSAL | Status: AC
  Administered 2024-08-16 – 2024-08-22 (×26): 15 mL via OROMUCOSAL

## 2024-08-16 MED ORDER — FREE WATER
200.0000 mL | Status: DC
  Administered 2024-08-16 – 2024-08-18 (×11): 200 mL

## 2024-08-16 MED ORDER — SODIUM PHOSPHATES 45 MMOLE/15ML IV SOLN
30.0000 mmol | Freq: Once | INTRAVENOUS | Status: AC
  Administered 2024-08-16: 30 mmol via INTRAVENOUS
  Filled 2024-08-16: qty 10

## 2024-08-16 MED ORDER — CARMEX CLASSIC LIP BALM EX OINT
1.0000 | TOPICAL_OINTMENT | CUTANEOUS | Status: AC | PRN
  Filled 2024-08-16: qty 10

## 2024-08-16 MED ORDER — SALINE SPRAY 0.65 % NA SOLN
1.0000 | NASAL | Status: AC | PRN
  Filled 2024-08-16: qty 44

## 2024-08-16 NOTE — Plan of Care (Signed)

## 2024-08-16 NOTE — Progress Notes (Signed)
 Patient desat. Placed on HFNC 15L, sats improved to 92%. MD and RN notified.

## 2024-08-16 NOTE — Progress Notes (Signed)
 "  NAME:  Zachary Heath, MRN:  [id number], DOB:  [birth year], LOS: 5 ADMISSION DATE:  08/10/2024, CONSULTATION DATE:  08/11/24 REFERRING MD:  Dr. Francesco , CHIEF COMPLAINT:  SOB   History of Present Illness:   27 yoM with hx of former smoker, asthma, Parkinson's disease, CAD s/p CABG, CKD3b, hypothyroidism, HTN, GERD Presented to ER 1/25 after development over last 24hrs of multiple falls at home, found down on floor for ~9hrs overnight, productive cough with yellowish sputum, fever, chills, generalized weakness, rhinorrhea.  Also recent poor PO intake over several days.  Lives with daughter.  In ER, found febrile 103 rectal, tachypneic, 95% on RA.  On workup, found to be Flu A positive and CXR with bilateral infrahilar opacities suspicious for aspiration.  Lactic reassuring, PCT 0.32, WBC 11.1, sCr 1.6> 1.42, troponin 111> 117, pBNP 2711, CK 4670.   CTH neg for acute intracranial abnormality, CT cspine neg, AXR with dilated bowel loop concerning for possible ileus vs obstruction.  Started on empiric doxycycline , ceftriaxone  and tamiflu  and admitted to IMTS.   Noted to having increasing respiratory distress requiring NTS this am therefore PCCM consulted.  More hypertensive in which IMTS is adjusting his home medications.  Remains on 3L [place] at 94-100%.  Pt currently reports he is feeling better, currently denies any pain, SOB, N/V/D, abd pain.   1/28 pccm reconsulted due to increasing sob on HHFNC 100% fio2.  Pertinent  Medical History   Past Medical History:  Diagnosis Date   Barrett's esophagus    Coronary artery disease    GERD (gastroesophageal reflux disease)    Glossodynia    Hypertension    Parkinson's disease (HCC)    Significant Hospital Events: Including procedures, antibiotic start and stop dates in addition to other pertinent events   1/25 admit ITMS 1/28 worsening respiratory distress and increase o2 requirements on HHFNC; transfer to icu and intubated  1/30 patient spiked  fever with Tmax 100.5, sedation was decreased to try pressure support trial  Interim History / Subjective:  Patient remained afebrile Tolerating spontaneous breathing trial, awake and following commands  Objective    Blood pressure 129/75, pulse (!) 107, temperature 99.3 F (37.4 C), temperature source Axillary, resp. rate (!) 25, height 5' 7 (1.702 m), weight 67.5 kg, SpO2 97%.    Vent Mode: PSV;CPAP FiO2 (%):  [40 %] 40 % Set Rate:  [22 bmp] 22 bmp Vt Set:  [530 mL] 530 mL PEEP:  [5 cmH20] 5 cmH20 Pressure Support:  [5 cmH20] 5 cmH20 Plateau Pressure:  [15 cmH20-19 cmH20] 15 cmH20   Intake/Output Summary (Last 24 hours) at 08/16/2024 1128 Last data filed at 08/16/2024 9085 Gross per 24 hour  Intake 4941.95 ml  Output 2675 ml  Net 2266.95 ml   Filed Weights   08/14/24 0500 08/15/24 0500 08/16/24 0500  Weight: 67.5 kg 70.5 kg 67.5 kg     Physical exam: General: Crtitically ill-appearing elderly male, orally intubated HEENT: [place]/AT, eyes anicteric.  ETT and OGT in place Neuro: Awake, following commands, moving all 4 extremities Chest: Bilateral fine crackles at bases, no wheezes or rhonchi Heart: Regular rate and rhythm, no murmurs or gallops Abdomen: Soft, nondistended, bowel sounds present  Labs and images reviewed  Patient Lines/Drains/Airways Status     Active Line/Drains/Airways     Name Placement date Placement time Site Days   Peripheral IV 08/12/24 22 G 1.75 Anterior;Right Forearm 08/12/24  0258  Forearm  4   Peripheral IV 08/13/24 20 G  Posterior;Right Forearm 08/13/24  0441  Forearm  3   NG/OG Vented/Dual Lumen Left nare External length of tube 65 cm 08/13/24  1746  Left nare  3   Flatus Tube/Pouch 08/15/24  0500  --  1   Urethral Catheter Marissa Double-lumen 16 Fr. 08/13/24  1816  Double-lumen  3   Airway 8 mm 08/13/24  0908  -- 3               Resolved problem list  Hypoglycemia  Assessment and Plan  Acute hypoxic respiratory  failure Influenza A pneumonia Superimposed bacterial pneumonia Transferred to ICU 1/28 for increased work of breathing, tachypnea, hypoxia requiring 100% HHFNC, ultimately intubated 1/28. Respiratory culture without growth, blood cultures negative to date.  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  Critical care was necessary to treat or prevent imminent or life-threatening deterioration.  Critical care was time spent personally by me on the following activities: development of treatment plan with patient and/or surrogate as well as nursing, discussions with consultants, evaluation of patient's response to treatment, examination of patient, obtaining history from patient or surrogate, ordering and performing treatments and interventions, ordering and review of laboratory studies, ordering and review of radiographic studies, pulse oximetry, re-evaluation of patient's condition and participation  in multidisciplinary rounds.   During this encounter critical care time was devoted to patient care services described in this note for 38 minutes.    Valinda Novas, MD [facility] See Amion for pager If no response to pager, please call [phone] until 7pm After 7pm, Please call E-link [phone]  "

## 2024-08-16 NOTE — Progress Notes (Signed)
 VENTILATOR WEAN NOTE 08/16/2024  Start Mode: PRVC  Wean Mode: Pressure Support  Duration before failure:   Reason for failure: N/A  Notes: RT attempted vent wean PS/CPAP 5/5. Patient tolerating well at this time. RN notified.

## 2024-08-16 NOTE — Procedures (Signed)
 Extubation Procedure Note  Patient Details:   Name: Zachary Heath DOB: 1946/03/25 MRN: [id number]   Airway Documentation:    Vent end date: 08/16/24 Vent end time: 1145   Evaluation  O2 sats: stable throughout Complications: No apparent complications Patient did tolerate procedure well. Bilateral Breath Sounds: Rhonchi   Yes  Positive cuff leak noted. Patient placed on [place] 4L with humidity, no stridor noted. Patient able to reach 500 mL using the incentive spirometer.  Cy RAMAN [name] 08/16/2024, 2:50 PM

## 2024-08-17 ENCOUNTER — Inpatient Hospital Stay (HOSPITAL_COMMUNITY)

## 2024-08-17 DIAGNOSIS — E876 Hypokalemia: Secondary | ICD-10-CM | POA: Diagnosis not present

## 2024-08-17 DIAGNOSIS — E870 Hyperosmolality and hypernatremia: Secondary | ICD-10-CM | POA: Diagnosis not present

## 2024-08-17 DIAGNOSIS — J9601 Acute respiratory failure with hypoxia: Secondary | ICD-10-CM | POA: Diagnosis not present

## 2024-08-17 DIAGNOSIS — J09X1 Influenza due to identified novel influenza A virus with pneumonia: Secondary | ICD-10-CM | POA: Diagnosis not present

## 2024-08-17 LAB — GLUCOSE, CAPILLARY
Glucose-Capillary: 119 mg/dL — ABNORMAL HIGH (ref 70–99)
Glucose-Capillary: 119 mg/dL — ABNORMAL HIGH (ref 70–99)
Glucose-Capillary: 135 mg/dL — ABNORMAL HIGH (ref 70–99)
Glucose-Capillary: 123 mg/dL — ABNORMAL HIGH (ref 70–99)
Glucose-Capillary: 115 mg/dL — ABNORMAL HIGH (ref 70–99)
Glucose-Capillary: 116 mg/dL — ABNORMAL HIGH (ref 70–99)

## 2024-08-17 LAB — CBC
HCT: 47.4 % (ref 39.0–52.0)
Hemoglobin: 15.1 g/dL (ref 13.0–17.0)
MCH: 28.4 pg (ref 26.0–34.0)
MCHC: 31.9 g/dL (ref 30.0–36.0)
MCV: 89.3 fL (ref 80.0–100.0)
Platelets: 293 10*3/uL (ref 150–400)
RBC: 5.31 MIL/uL (ref 4.22–5.81)
RDW: 15.5 % (ref 11.5–15.5)
WBC: 10.3 10*3/uL (ref 4.0–10.5)
nRBC: 0.2 % (ref 0.0–0.2)

## 2024-08-17 LAB — COMPREHENSIVE METABOLIC PANEL WITH GFR
ALT: 104 U/L — ABNORMAL HIGH (ref 0–44)
AST: 134 U/L — ABNORMAL HIGH (ref 15–41)
Albumin: 2.7 g/dL — ABNORMAL LOW (ref 3.5–5.0)
Alkaline Phosphatase: 216 U/L — ABNORMAL HIGH (ref 38–126)
Anion gap: 12 (ref 5–15)
BUN: 62 mg/dL — ABNORMAL HIGH (ref 8–23)
CO2: 22 mmol/L (ref 22–32)
Calcium: 8.9 mg/dL (ref 8.9–10.3)
Chloride: 116 mmol/L — ABNORMAL HIGH (ref 98–111)
Creatinine, Ser: 1.54 mg/dL — ABNORMAL HIGH (ref 0.61–1.24)
GFR, Estimated: 45 mL/min — ABNORMAL LOW
Glucose, Bld: 139 mg/dL — ABNORMAL HIGH (ref 70–99)
Potassium: 4.7 mmol/L (ref 3.5–5.1)
Sodium: 149 mmol/L — ABNORMAL HIGH (ref 135–145)
Total Bilirubin: 0.2 mg/dL (ref 0.0–1.2)
Total Protein: 6.1 g/dL — ABNORMAL LOW (ref 6.5–8.1)

## 2024-08-17 LAB — PHOSPHORUS: Phosphorus: 3.4 mg/dL (ref 2.5–4.6)

## 2024-08-17 MED ORDER — SODIUM CHLORIDE 0.9 % IV SOLN
150.0000 mg | INTRAVENOUS | Status: DC
  Administered 2024-08-17 – 2024-08-18 (×2): 150 mg via INTRAVENOUS
  Filled 2024-08-17 (×2): qty 7.5

## 2024-08-17 MED ORDER — IPRATROPIUM-ALBUTEROL 0.5-2.5 (3) MG/3ML IN SOLN: 3.0000 mL | RESPIRATORY_TRACT | Status: DC | PRN

## 2024-08-17 MED ORDER — IPRATROPIUM-ALBUTEROL 0.5-2.5 (3) MG/3ML IN SOLN
RESPIRATORY_TRACT | Status: AC
  Administered 2024-08-17: 3 mL
  Filled 2024-08-17: qty 3

## 2024-08-17 MED ORDER — ALPRAZOLAM 0.25 MG PO TABS
0.2500 mg | ORAL_TABLET | Freq: Three times a day (TID) | ORAL | Status: AC | PRN
  Administered 2024-08-17 – 2024-08-22 (×5): 0.25 mg via NASOGASTRIC
  Filled 2024-08-17 (×4): qty 1

## 2024-08-17 MED ORDER — AMLODIPINE BESYLATE 10 MG PO TABS
10.0000 mg | ORAL_TABLET | Freq: Every day | ORAL | Status: AC
  Administered 2024-08-17 – 2024-08-22 (×6): 10 mg
  Filled 2024-08-17 (×6): qty 1

## 2024-08-17 MED ORDER — ALPRAZOLAM 0.25 MG PO TABS
0.2500 mg | ORAL_TABLET | Freq: Three times a day (TID) | ORAL | Status: DC | PRN
  Filled 2024-08-17: qty 1

## 2024-08-17 MED ORDER — HYDRALAZINE HCL 20 MG/ML IJ SOLN: 5.0000 mg | INTRAMUSCULAR | Status: AC | PRN

## 2024-08-17 NOTE — Progress Notes (Signed)
 08/17/2024 PRN hydralazine  ordered for SBP > 190 or DBP > 110 on multiple reads  Rolan Sharps MD PCCM

## 2024-08-17 NOTE — Progress Notes (Signed)
 This RN noticed upper lip swelling upon assessment. MD and [facility] notified.

## 2024-08-17 NOTE — Progress Notes (Signed)
 "  NAME:  Zachary Heath, MRN:  [id number], DOB:  [birth year], LOS: 6 ADMISSION DATE:  08/10/2024, CONSULTATION DATE:  08/11/24 REFERRING MD:  Dr. Francesco , CHIEF COMPLAINT:  SOB   History of Present Illness:   61 yoM with hx of former smoker, asthma, Parkinson's disease, CAD s/p CABG, CKD3b, hypothyroidism, HTN, GERD Presented to ER 1/25 after development over last 24hrs of multiple falls at home, found down on floor for ~9hrs overnight, productive cough with yellowish sputum, fever, chills, generalized weakness, rhinorrhea.  Also recent poor PO intake over several days.  Lives with daughter.  In ER, found febrile 103 rectal, tachypneic, 95% on RA.  On workup, found to be Flu A positive and CXR with bilateral infrahilar opacities suspicious for aspiration.  Lactic reassuring, PCT 0.32, WBC 11.1, sCr 1.6> 1.42, troponin 111> 117, pBNP 2711, CK 4670.   CTH neg for acute intracranial abnormality, CT cspine neg, AXR with dilated bowel loop concerning for possible ileus vs obstruction.  Started on empiric doxycycline , ceftriaxone  and tamiflu  and admitted to IMTS.   Noted to having increasing respiratory distress requiring NTS this am therefore PCCM consulted.  More hypertensive in which IMTS is adjusting his home medications.  Remains on 3L [place] at 94-100%.  Pt currently reports he is feeling better, currently denies any pain, SOB, N/V/D, abd pain.   1/28 pccm reconsulted due to increasing sob on HHFNC 100% fio2.  Pertinent  Medical History   Past Medical History:  Diagnosis Date   Barrett's esophagus    Coronary artery disease    GERD (gastroesophageal reflux disease)    Glossodynia    Hypertension    Parkinson's disease (HCC)    Significant Hospital Events: Including procedures, antibiotic start and stop dates in addition to other pertinent events   1/25 admit ITMS 1/28 worsening respiratory distress and increase o2 requirements on HHFNC; transfer to icu and intubated  1/30 patient spiked  fever with Tmax 100.5, sedation was decreased to try pressure support trial 1/31 patient was extubated, requiring 15 L oxygen, tachypneic, afebrile  Interim History / Subjective:  Patient remained afebrile Overnight his oxygen remained at 15 L, now turned down to 10 L, tolerating well His work of breathing has improved, with respiratory rate in 20s Overnight he was hypertensive likely due to anxiety and increased work of breathing, now his blood pressure is controlled  Objective    Blood pressure (!) 148/79, pulse (!) 101, temperature 97.8 F (36.6 C), temperature source Oral, resp. rate (!) 30, height 5' 7 (1.702 m), weight 71.2 kg, SpO2 95%.    Vent Mode: PSV;CPAP FiO2 (%):  [40 %] 40 % PEEP:  [5 cmH20] 5 cmH20 Pressure Support:  [5 cmH20] 5 cmH20   Intake/Output Summary (Last 24 hours) at 08/17/2024 9167 Last data filed at 08/17/2024 0600 Gross per 24 hour  Intake 3727.28 ml  Output 1900 ml  Net 1827.28 ml   Filed Weights   08/15/24 0500 08/16/24 0500 08/17/24 0500  Weight: 70.5 kg 67.5 kg 71.2 kg     Physical exam: General: Acute on chronically ill-appearing elderly male, lying on the bed HEENT: [place]/AT, eyes anicteric.  moist mucus membranes.  NGT in place.  Upper lip is swollen with dry mucous membranes Neuro: Alert, awake following commands, moving all 4 extremities Chest: Coarse breath sounds, no wheezes or rhonchi Heart: Regular rate and rhythm, no murmurs or gallops Abdomen: Soft, nontender, nondistended, bowel sounds present  Labs reviewed Respiratory culture is growing ABUNDANT CANDIDA  PARAPSILOSIS   Patient Lines/Drains/Airways Status     Active Line/Drains/Airways     Name Placement date Placement time Site Days   Peripheral IV 08/13/24 20 G Posterior;Right Forearm 08/13/24  0441  Forearm  4   Peripheral IV 08/16/24 22 G 1 Anterior;Left Forearm 08/16/24  1330  Forearm  1   NG/OG Vented/Dual Lumen Left nare External length of tube 65 cm 08/13/24  1746  Left  nare  4   Urethral Catheter Marissa Double-lumen 16 Fr. 08/13/24  1816  Double-lumen  4               Resolved problem list  Hypoglycemia  Assessment and Plan  Acute hypoxic respiratory failure Influenza A pneumonia Superimposed bacterial/fungal pneumonia Transferred to ICU 1/28 for increased work of breathing, tachypnea, hypoxia requiring 100% HHFNC, ultimately intubated 1/28. Respiratory culture without growth, blood cultures negative to date.  Patient was successfully extubated He required 15 L [place] oxygen yesterday, now titrated down to 10 L He started feeling better Continue droplet precaution Completed therapy with Tamiflu  Completed therapy with ceftriaxone  and doxycycline  Respiratory culture is growing abundant Candida Parapsilosis, after speaking with ID, started on micafungin  to complete 7-day therapy Continue corrective nasal cannula oxygen with O2 sat goal 88-92%  Hypernatremia Hypokalemia/hypophosphatemia AKI on CKD 3B Serum sodium is trended up to 149 Continue free water  flushes 200 mL Q 4-hour Continue aggressive electrolyte replacement Serum creatinine continue to trend down, currently at 1.5 Avoid nephrotoxic agent Off IV fluid  Liver cirrhosis, 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  Critical care was necessary to treat or prevent imminent or life-threatening deterioration.  Critical care was time spent personally by me  on the following activities: development of treatment plan with patient and/or surrogate as well as nursing, discussions with consultants, evaluation of patient's response to treatment, examination of patient, obtaining history from patient or surrogate, ordering and performing treatments and interventions, ordering and review of laboratory studies, ordering and review of radiographic studies, pulse oximetry, re-evaluation of patient's condition and participation in multidisciplinary rounds.   During this encounter critical care time was devoted to patient care services described in this note for 35 minutes.    Valinda Novas, MD [facility] See Amion for pager If no response to pager, please call [phone] until 7pm After 7pm, Please call E-link [phone]  "

## 2024-08-17 NOTE — Plan of Care (Signed)
" °  Problem: Health Behavior/Discharge Planning: Goal: Ability to manage health-related needs will improve Outcome: Progressing   Problem: Nutrition: Goal: Adequate nutrition will be maintained Outcome: Progressing   Problem: Elimination: Goal: Will not experience complications related to bowel motility Outcome: Progressing   Problem: Pain Managment: Goal: General experience of comfort will improve and/or be controlled Outcome: Progressing   Problem: Clinical Measurements: Goal: Ability to maintain a body temperature in the normal range will improve Outcome: Progressing   Problem: Clinical Measurements: Goal: Respiratory complications will improve Outcome: Not Progressing Goal: Cardiovascular complication will be avoided Outcome: Not Progressing   Problem: Activity: Goal: Risk for activity intolerance will decrease Outcome: Not Progressing   Problem: Coping: Goal: Level of anxiety will decrease Outcome: Not Progressing   Problem: Skin Integrity: Goal: Risk for impaired skin integrity will decrease Outcome: Not Progressing   Problem: Activity: Goal: Ability to tolerate increased activity will improve Outcome: Not Progressing   Problem: Respiratory: Goal: Ability to maintain adequate ventilation will improve Outcome: Not Progressing Goal: Ability to maintain a clear airway will improve Outcome: Not Progressing   "

## 2024-08-17 NOTE — Evaluation (Signed)
 Clinical/Bedside Swallow Evaluation Patient Details  Name: Zachary Heath MRN: [id number] Date of Birth: [birth year]  Today's Date: 08/17/2024 Time: SLP Start Time (ACUTE ONLY): 1030 SLP Stop Time (ACUTE ONLY): 1040 SLP Time Calculation (min) (ACUTE ONLY): 10 min  Past Medical History:  Past Medical History:  Diagnosis Date   Barrett's esophagus    Coronary artery disease    GERD (gastroesophageal reflux disease)    Glossodynia    Hypertension    Parkinson's disease (HCC)    Past Surgical History:  Past Surgical History:  Procedure Laterality Date   CARDIAC CATHETERIZATION     CORONARY ARTERY BYPASS GRAFT  01/14/2021   Dr Rosalynn Dines at [facility]   HPI:  79 yo male presenting to ED 1/25 with URI symptoms and a fall. Found to be Flu A positive and CT Abdomen shows potential ileus. CXR with bilateral infrahilar opacities suspicious for infection. CT Cervical Spine negative for acute fx but shows disc space narrowing at multiple levels, most pronounced at C3-C4 and small disc osteophyte complexes at multiple levels. MBS this admission 1/27 with severe oropharyngeal dysphagia, kept NPO.  Subsequently intubated 1/28-31. MBS 01/25/21 with no penetration/aspiration and mild oral deficits in addition to a suspected esophageal component with reported globus sensation. Esophagram 12/14/17 shows evidence of prior Nissen fundoplication without evidence of recurrent hernia and although widely patent, the 13 mm barium pill would not pass through it. PMH: Parkinson's disease, CAD s/p CABG (2022), CKD 3B, hypothyroidism, asthma, HTN, Barrett's esophagus, GERD    Assessment / Plan / Recommendation  Clinical Impression  Pt with known hx severe oropharyngeal dysphagia with MBS 1/27 recommending maintaining NPO status.  Pt seen for repeat swallowing evalaution s/p 4 day intubation.  Pt's vocal quality is now aphonic.  Vocal fold adduction perceived during cough. SLP provided oral  care prior to administration of PO trials. Pt with reflexive cough following both ice chips and thin liquid by spoon.  He is eager for POs.  Pt would benefit from repeat instrumental evaluation prior to intiation of POs given acute changes noted post extubation. He is agreeable to FEES which would allow for visualization of vocal folds to assess mobility with noted changes to vocal quality post extubation.  Pt has NG in place.  FEES tentatively planned for next date.   Recommend pt remain NPO with alternate means of nutrition, hydration, and medicaiton.   Pt may have ice chips by spoon, one at a time, in moderation, after good oral care, when fully awake/alert, with upright positioning and supervision.   SLP Visit Diagnosis: Dysphagia, oropharyngeal phase (R13.12)    Aspiration Risk  Severe aspiration risk    Diet Recommendation NPO    Medication Administration: Via alternative means    Other Recommendations Oral Care Recommendations: Oral care QID;Oral care prior to ice chip/H20      Functional Status Assessment Patient has had a recent decline in their functional status and demonstrates the ability to make significant improvements in function in a reasonable and predictable amount of time.  Frequency and Duration min 2x/week  2 weeks       Prognosis Prognosis for improved oropharyngeal function: Good      Swallow Study   General Date of Onset: 08/10/24 HPI: 79 yo male presenting to ED 1/25 with URI symptoms and a fall. Found to be Flu A positive and CT Abdomen shows potential ileus. CXR with bilateral infrahilar opacities suspicious for infection. CT Cervical Spine negative for acute  fx but shows disc space narrowing at multiple levels, most pronounced at C3-C4 and small disc osteophyte complexes at multiple levels. MBS this admission 1/27 with severe oropharyngeal dysphagia, kept NPO.  Subsequently intubated 1/28-31. MBS 01/25/21 with no penetration/aspiration and mild oral deficits  in addition to a suspected esophageal component with reported globus sensation. Esophagram 12/14/17 shows evidence of prior Nissen fundoplication without evidence of recurrent hernia and although widely patent, the 13 mm barium pill would not pass through it. PMH: Parkinson's disease, CAD s/p CABG (2022), CKD 3B, hypothyroidism, asthma, HTN, Barrett's esophagus, GERD Type of Study: Bedside Swallow Evaluation Previous Swallow Assessment: MBS this admission and in 2022 (see above) Diet Prior to this Study: NPO Temperature Spikes Noted: No Respiratory Status: Nasal cannula History of Recent Intubation: Yes Total duration of intubation (days): 4 days Date extubated: 08/16/24 Behavior/Cognition: Alert;Cooperative;Pleasant mood Oral Cavity Assessment: Within Functional Limits Oral Care Completed by SLP: Yes Oral Cavity - Dentition: Adequate natural dentition Self-Feeding Abilities: Needs assist Patient Positioning: Upright in bed Baseline Vocal Quality: Aphonic Volitional Cough: Weak Volitional Swallow: Able to elicit    Oral/Motor/Sensory Function Overall Oral Motor/Sensory Function: Mild impairment Facial ROM: Reduced right;Reduced left (upper lip swollen)   Ice Chips Ice chips: Impaired Presentation: Spoon Pharyngeal Phase Impairments: Cough - Delayed   Thin Liquid Thin Liquid: Impaired Presentation: Spoon Pharyngeal  Phase Impairments: Cough - Immediate;Cough - Delayed    Nectar Thick Nectar Thick Liquid: Not tested   Honey Thick Honey Thick Liquid: Not tested   Puree Puree: Not tested   Solid     Solid: Not tested      Anette FORBES Grippe, MA, CCC-SLP Acute Rehabilitation Services Office: [phone] 08/17/2024,11:03 AM

## 2024-08-17 NOTE — Plan of Care (Signed)
 Pt alert and oriented.  Fc x 4.  Tolerating tf.  Awaiting swallow eval.  VSS.  Urine output adequate.  Bowels moving.  O2 weaned from 10L to 6L.

## 2024-08-18 ENCOUNTER — Inpatient Hospital Stay (HOSPITAL_COMMUNITY)

## 2024-08-18 DIAGNOSIS — E039 Hypothyroidism, unspecified: Secondary | ICD-10-CM | POA: Diagnosis not present

## 2024-08-18 DIAGNOSIS — N1832 Chronic kidney disease, stage 3b: Secondary | ICD-10-CM | POA: Diagnosis not present

## 2024-08-18 DIAGNOSIS — J101 Influenza due to other identified influenza virus with other respiratory manifestations: Secondary | ICD-10-CM | POA: Diagnosis not present

## 2024-08-18 DIAGNOSIS — G20C Parkinsonism, unspecified: Secondary | ICD-10-CM | POA: Diagnosis not present

## 2024-08-18 DIAGNOSIS — I25810 Atherosclerosis of coronary artery bypass graft(s) without angina pectoris: Secondary | ICD-10-CM

## 2024-08-18 DIAGNOSIS — J45909 Unspecified asthma, uncomplicated: Secondary | ICD-10-CM | POA: Diagnosis not present

## 2024-08-18 DIAGNOSIS — J189 Pneumonia, unspecified organism: Secondary | ICD-10-CM | POA: Diagnosis not present

## 2024-08-18 DIAGNOSIS — E870 Hyperosmolality and hypernatremia: Secondary | ICD-10-CM | POA: Diagnosis not present

## 2024-08-18 DIAGNOSIS — I129 Hypertensive chronic kidney disease with stage 1 through stage 4 chronic kidney disease, or unspecified chronic kidney disease: Secondary | ICD-10-CM | POA: Diagnosis not present

## 2024-08-18 DIAGNOSIS — N179 Acute kidney failure, unspecified: Secondary | ICD-10-CM | POA: Diagnosis not present

## 2024-08-18 DIAGNOSIS — E876 Hypokalemia: Secondary | ICD-10-CM | POA: Diagnosis not present

## 2024-08-18 DIAGNOSIS — J9601 Acute respiratory failure with hypoxia: Secondary | ICD-10-CM | POA: Diagnosis not present

## 2024-08-18 LAB — CBC
HCT: 40.5 % (ref 39.0–52.0)
Hemoglobin: 13.2 g/dL (ref 13.0–17.0)
MCH: 28.8 pg (ref 26.0–34.0)
MCHC: 32.6 g/dL (ref 30.0–36.0)
MCV: 88.4 fL (ref 80.0–100.0)
Platelets: 320 10*3/uL (ref 150–400)
RBC: 4.58 MIL/uL (ref 4.22–5.81)
RDW: 15.3 % (ref 11.5–15.5)
WBC: 11.8 10*3/uL — ABNORMAL HIGH (ref 4.0–10.5)
nRBC: 0.2 % (ref 0.0–0.2)

## 2024-08-18 LAB — COMPREHENSIVE METABOLIC PANEL WITH GFR
ALT: 113 U/L — ABNORMAL HIGH (ref 0–44)
AST: 108 U/L — ABNORMAL HIGH (ref 15–41)
Albumin: 2.5 g/dL — ABNORMAL LOW (ref 3.5–5.0)
Alkaline Phosphatase: 187 U/L — ABNORMAL HIGH (ref 38–126)
Anion gap: 11 (ref 5–15)
BUN: 54 mg/dL — ABNORMAL HIGH (ref 8–23)
CO2: 24 mmol/L (ref 22–32)
Calcium: 8.7 mg/dL — ABNORMAL LOW (ref 8.9–10.3)
Chloride: 116 mmol/L — ABNORMAL HIGH (ref 98–111)
Creatinine, Ser: 1.35 mg/dL — ABNORMAL HIGH (ref 0.61–1.24)
GFR, Estimated: 53 mL/min — ABNORMAL LOW
Glucose, Bld: 150 mg/dL — ABNORMAL HIGH (ref 70–99)
Potassium: 4.2 mmol/L (ref 3.5–5.1)
Sodium: 151 mmol/L — ABNORMAL HIGH (ref 135–145)
Total Bilirubin: 0.4 mg/dL (ref 0.0–1.2)
Total Protein: 5.5 g/dL — ABNORMAL LOW (ref 6.5–8.1)

## 2024-08-18 LAB — URINALYSIS, ROUTINE W REFLEX MICROSCOPIC
Bilirubin Urine: NEGATIVE
Glucose, UA: >=500 mg/dL — AB
Ketones, ur: NEGATIVE mg/dL
Leukocytes,Ua: NEGATIVE
Nitrite: NEGATIVE
Protein, ur: 30 mg/dL — AB
Specific Gravity, Urine: 1.014 (ref 1.005–1.030)
pH: 6 (ref 5.0–8.0)

## 2024-08-18 LAB — GLUCOSE, CAPILLARY
Glucose-Capillary: 118 mg/dL — ABNORMAL HIGH (ref 70–99)
Glucose-Capillary: 120 mg/dL — ABNORMAL HIGH (ref 70–99)
Glucose-Capillary: 120 mg/dL — ABNORMAL HIGH (ref 70–99)
Glucose-Capillary: 131 mg/dL — ABNORMAL HIGH (ref 70–99)
Glucose-Capillary: 140 mg/dL — ABNORMAL HIGH (ref 70–99)
Glucose-Capillary: 148 mg/dL — ABNORMAL HIGH (ref 70–99)

## 2024-08-18 MED ORDER — FREE WATER
300.0000 mL | Status: DC
  Administered 2024-08-18 – 2024-08-20 (×13): 300 mL

## 2024-08-18 MED ORDER — ACETAMINOPHEN 160 MG/5ML PO SOLN
650.0000 mg | ORAL | Status: AC | PRN
  Administered 2024-08-18 – 2024-08-19 (×3): 650 mg
  Filled 2024-08-18 (×3): qty 20.3

## 2024-08-18 MED ORDER — SODIUM CHLORIDE 0.9 % IV SOLN
100.0000 mg | INTRAVENOUS | Status: AC
  Administered 2024-08-19 – 2024-08-22 (×4): 100 mg via INTRAVENOUS
  Filled 2024-08-18 (×5): qty 5

## 2024-08-18 MED ORDER — FUROSEMIDE 10 MG/ML IJ SOLN
40.0000 mg | Freq: Once | INTRAMUSCULAR | Status: AC
  Administered 2024-08-18: 40 mg via INTRAVENOUS
  Filled 2024-08-18: qty 4

## 2024-08-18 MED ORDER — BUDESONIDE 0.25 MG/2ML IN SUSP
0.2500 mg | Freq: Two times a day (BID) | RESPIRATORY_TRACT | Status: AC
  Administered 2024-08-18 – 2024-08-22 (×9): 0.25 mg via RESPIRATORY_TRACT
  Filled 2024-08-18 (×9): qty 2

## 2024-08-18 MED ORDER — ARFORMOTEROL TARTRATE 15 MCG/2ML IN NEBU
15.0000 ug | INHALATION_SOLUTION | Freq: Two times a day (BID) | RESPIRATORY_TRACT | Status: AC
  Administered 2024-08-18 – 2024-08-22 (×9): 15 ug via RESPIRATORY_TRACT
  Filled 2024-08-18 (×9): qty 2

## 2024-08-18 MED ORDER — SODIUM CHLORIDE 0.9 % IV SOLN
2.0000 g | Freq: Two times a day (BID) | INTRAVENOUS | Status: AC
  Administered 2024-08-18 – 2024-08-22 (×10): 2 g via INTRAVENOUS
  Filled 2024-08-18 (×10): qty 12.5

## 2024-08-18 MED ORDER — ALBUTEROL SULFATE (2.5 MG/3ML) 0.083% IN NEBU
2.5000 mg | INHALATION_SOLUTION | RESPIRATORY_TRACT | Status: AC | PRN
  Administered 2024-08-18: 2.5 mg via RESPIRATORY_TRACT
  Filled 2024-08-18: qty 3

## 2024-08-18 MED ORDER — NUTRISOURCE FIBER PO PACK
1.0000 | PACK | Freq: Two times a day (BID) | ORAL | Status: AC
  Administered 2024-08-18 – 2024-08-22 (×8): 1
  Filled 2024-08-18 (×10): qty 1

## 2024-08-18 NOTE — Progress Notes (Signed)
 SLP Cancellation Note  Patient Details Name: Zachary Heath MRN: [id number] DOB: September 01, 1945   Cancelled treatment:        Attempted to see pt for FEES. Pt has increasing O2 needs and placed on [place].  He reported difficulty breathing to SLP in room.  RN notified.  Will hold further assessment today with plan for FEES when respiratory status improves.   Anette FORBES Grippe, MA, CCC-SLP Acute Rehabilitation Services Office: [phone] 08/18/2024, 9:12 AM

## 2024-08-18 NOTE — Progress Notes (Signed)
 eLink Physician-Brief Progress Note Patient Name: Zachary Heath DOB: 1946/03/26 MRN: [id number]   Date of Service  08/18/2024  HPI/Events of Note  Notified of fever with Tmax 101.7.  Pt is only on micafungin  for candida in sputum. He completed treatment for flu and pneumonia.  eICU Interventions  Repeat blood cultures, sputum culture and urinalysis.  Ok to give tylenol  via tube.      Intervention Category Intermediate Interventions: Infection - evaluation and management  Theodor Mustin 08/18/2024, 12:56 AM

## 2024-08-18 NOTE — Progress Notes (Signed)
 Received report at this time.

## 2024-08-18 NOTE — Progress Notes (Signed)
 Palliative:  HPI: 79 y.o. male  with past medical history of Parkinson's disease, CAD s/p CABG (2022), CKD 3b, asthma, hypothyroidism, GERD, Barrett's esophagus, and HTN.  He presented to the ED on 08/10/2024 after multiple falls at home and was found down on the floor.  Also with productive cough, fever, chills, generalized weakness, and poor p.o. intake. He was found to be flu a positive.  He is admitted with acute hypoxic respiratory failure and sepsis secondary to pneumonia (aspiration versus viral). On 1/28, he developed worsening respiratory distress and required intubation. Palliative Medicine has been consulted for goals of care discussions and complex medical decision making.   I met today at Zachary Heath bedside. No family present. Discussed with RN. I met with Zachary Heath and introduced myself. We reviewed his high oxygen needs. He understands that he is still critically ill. He tells me that he is still hopeful for improvement. He asks me - Am I going to make it? I shared with him that I do not know the answer to this. I shared that his medical team is doing all they can to help him but he continues to be very ill. He expresses understanding. He shares about his strong faith. We discussed high risk for intubation. He confirms that he would desire re-intubation. I clarified that if he needs re-intubation that this would not be indicative of a favorable outcome. He verbalizes understanding and confirms desire for intubation if indicated. He gives me permission to call and speak with his daughter, Zachary Heath.   I called and spoke with daughter, Zachary Heath. Zachary Heath shares that she tried to leave to come visit but her car is stuck in the snow and she was unable to leave her home. We had a good conversation about her father's condition. She asked many insightful questions. We reviewed oxygen needs, CXR, labs. I shared my conversation with her father as well. Zachary Heath is hopeful for improvement but understands  he is not out of the woods. She endorses that he was DNR status previously but requested full code. She understands that he would desire intubation again if indicated. She wonders if he would desire CPR and asks me to discuss with him. She understands that these interventions could lead to suffering and poor quality of life and she does not want that for her father. She does support the decisions he makes for himself and wants him to be able to continue to make his own decisions. I will try and speak with him about parameters of intubation as well.   Update: I returned to visit with Zachary Heath. I shared that I spoke with Zachary Heath. I shared that I had some questions for him. We discussed decisions such as intubation and CPR and expectations if we pursue these interventions. Zachary Heath does share that he is suffering. He shares that he wants to improve but he is not sure this is going to happen for him. He talks of his faith and trusting God. He struggles to make any decisions or limits in care. He shares that he will leave this up to God. I reflected that we do not have control over the time of our death but we do have some control on what we go through while we are here. We discussed the importance of quality of life. Unfortunately he is unable to make any decisions today. He shares that he plans to take one day at a time. He thanks me for my visit.   Discussed with RN  and PCCM NP.   All questions/concerns addressed. Emotional support provided.   Exam: Awake, alert, oriented. HHFNC 60L 40%. No distress. Weak cough. NGT with TF. Abd soft.   Plan: - Full code - Needs ongoing discussions - Hopefully patient can clarify his wishes in the event of further decline  65 min  Zachary Kitty, NP Palliative Medicine Team Pager [phone] (Please see [email or web address] for schedule) Team Phone [phone]

## 2024-08-18 NOTE — Progress Notes (Signed)
 Nutrition Follow-up  DOCUMENTATION CODES:   Non-severe (moderate) malnutrition in context of chronic illness  INTERVENTION:   Continue tube feeding via NG tube: Osmolite 1.5 @ 45 ml/hr (1080 ml per day)  Prosource TF20 60 ml TID (1200, 2000, 2200)  Provides 1860 kcal, 127 gm protein, 820 ml free water  daily   300 ml free water  every 4 hours Total free water : 2620 ml   NUTRITION DIAGNOSIS:   Moderate Malnutrition related to chronic illness as evidenced by moderate fat depletion, moderate muscle depletion, severe muscle depletion. Ongoing.   GOAL:   Patient will meet greater than or equal to 90% of their needs Met with TF at goal   MONITOR:   TF tolerance  REASON FOR ASSESSMENT:    (cortrak)    ASSESSMENT:   Pt with PMH of Parkinson's, CAD s/p CABG, HTN, Barrett's esophagus, former smoker, asthma, CKD 3, and GERD, lives at home with daughter admitted after being found down after 9 hours, recent poor po intake over several days admitted with flu A and PNA.   Pt discussed during ICU rounds and with RN and MD.  Pt extubated, currently requiring 60 L HHFNC. SOB when talking to patient.   Temp (24hrs), Avg:99.9 F (37.7 C), Min:98.3 F (36.8 C), Max:101.7 F (38.7 C)   1/27 - seen by SLP who noted pt with severe oropharyngeal dysphagia  1/28 - worsening respiratory distress and increased O2 requirements; required intubation; NG tube placed (tip gastric)  1/31 - extubated   Medications reviewed and include: sinemet  IR 2 tab QID, nutrisource fiber BID, SSI every 4 hours, synthroid , solumedrol, SSI every 4 hours, synthroid , protonix , thiamine    Labs reviewed: Na 151 Ferritin 5570 (1/30)  Refeeding Labs: Recent Labs  Lab 08/14/24 0506 08/15/24 0549 08/16/24 0448 08/17/24 0333 08/18/24 0315  K 3.6 3.2* 4.8 4.7 4.2  MG 2.5* 2.1 2.1  --   --   PHOS 1.8* 2.3* 1.8* 3.4  --    Glucose Profile:  Recent Labs    08/18/24 0321 08/18/24 0755 08/18/24 1133   GLUCAP 140* 120* 148*     Diet Order:   Diet Order             Diet NPO time specified  Diet effective now                   EDUCATION NEEDS:   Education needs have been addressed  Skin:  Skin Assessment: Reviewed RN Assessment  Last BM:  2/2 small type 7, smear x 2  Height:   Ht Readings from Last 1 Encounters:  08/16/24 5' 7 (1.702 m)    Weight:   Wt Readings from Last 1 Encounters:  08/17/24 71.2 kg    BMI:  Body mass index is 24.58 kg/m.  Estimated Nutritional Needs:   Kcal:  1700-1900  Protein:  100-120 grams  Fluid:  >1.7 L/day  Powell SQUIBB., RD, LDN, CNSC See AMiON for contact information

## 2024-08-18 NOTE — Plan of Care (Signed)
 Pt alert and oriented.  On HHF 60L 40%.  Some anxiety noted.  Tolerating tf.  Bowels moving.  Urine output adequate.  Unable to tolerate activity. Pain managed.

## 2024-08-18 NOTE — Progress Notes (Signed)
 Patient placed on HHFNC due to increased SOB and WOB.

## 2024-08-19 ENCOUNTER — Inpatient Hospital Stay (HOSPITAL_COMMUNITY)

## 2024-08-19 DIAGNOSIS — K7460 Unspecified cirrhosis of liver: Secondary | ICD-10-CM | POA: Diagnosis not present

## 2024-08-19 DIAGNOSIS — G20A1 Parkinson's disease without dyskinesia, without mention of fluctuations: Secondary | ICD-10-CM | POA: Diagnosis not present

## 2024-08-19 DIAGNOSIS — J9601 Acute respiratory failure with hypoxia: Secondary | ICD-10-CM | POA: Diagnosis not present

## 2024-08-19 DIAGNOSIS — N1832 Chronic kidney disease, stage 3b: Secondary | ICD-10-CM | POA: Diagnosis not present

## 2024-08-19 DIAGNOSIS — I129 Hypertensive chronic kidney disease with stage 1 through stage 4 chronic kidney disease, or unspecified chronic kidney disease: Secondary | ICD-10-CM | POA: Diagnosis not present

## 2024-08-19 DIAGNOSIS — R7989 Other specified abnormal findings of blood chemistry: Secondary | ICD-10-CM | POA: Diagnosis not present

## 2024-08-19 DIAGNOSIS — J09X1 Influenza due to identified novel influenza A virus with pneumonia: Secondary | ICD-10-CM | POA: Diagnosis not present

## 2024-08-19 DIAGNOSIS — S0990XA Unspecified injury of head, initial encounter: Secondary | ICD-10-CM

## 2024-08-19 DIAGNOSIS — E039 Hypothyroidism, unspecified: Secondary | ICD-10-CM | POA: Diagnosis not present

## 2024-08-19 DIAGNOSIS — J45909 Unspecified asthma, uncomplicated: Secondary | ICD-10-CM | POA: Diagnosis not present

## 2024-08-19 DIAGNOSIS — N179 Acute kidney failure, unspecified: Secondary | ICD-10-CM | POA: Diagnosis not present

## 2024-08-19 DIAGNOSIS — E870 Hyperosmolality and hypernatremia: Secondary | ICD-10-CM | POA: Diagnosis not present

## 2024-08-19 DIAGNOSIS — I2510 Atherosclerotic heart disease of native coronary artery without angina pectoris: Secondary | ICD-10-CM | POA: Diagnosis not present

## 2024-08-19 LAB — COMPREHENSIVE METABOLIC PANEL WITH GFR
ALT: 151 U/L — ABNORMAL HIGH (ref 0–44)
AST: 117 U/L — ABNORMAL HIGH (ref 15–41)
Albumin: 2.6 g/dL — ABNORMAL LOW (ref 3.5–5.0)
Alkaline Phosphatase: 205 U/L — ABNORMAL HIGH (ref 38–126)
Anion gap: 9 (ref 5–15)
BUN: 45 mg/dL — ABNORMAL HIGH (ref 8–23)
CO2: 27 mmol/L (ref 22–32)
Calcium: 8.7 mg/dL — ABNORMAL LOW (ref 8.9–10.3)
Chloride: 113 mmol/L — ABNORMAL HIGH (ref 98–111)
Creatinine, Ser: 1.27 mg/dL — ABNORMAL HIGH (ref 0.61–1.24)
GFR, Estimated: 57 mL/min — ABNORMAL LOW
Glucose, Bld: 143 mg/dL — ABNORMAL HIGH (ref 70–99)
Potassium: 4.1 mmol/L (ref 3.5–5.1)
Sodium: 149 mmol/L — ABNORMAL HIGH (ref 135–145)
Total Bilirubin: 0.5 mg/dL (ref 0.0–1.2)
Total Protein: 5.9 g/dL — ABNORMAL LOW (ref 6.5–8.1)

## 2024-08-19 LAB — GLUCOSE, CAPILLARY
Glucose-Capillary: 102 mg/dL — ABNORMAL HIGH (ref 70–99)
Glucose-Capillary: 114 mg/dL — ABNORMAL HIGH (ref 70–99)
Glucose-Capillary: 116 mg/dL — ABNORMAL HIGH (ref 70–99)
Glucose-Capillary: 127 mg/dL — ABNORMAL HIGH (ref 70–99)
Glucose-Capillary: 132 mg/dL — ABNORMAL HIGH (ref 70–99)
Glucose-Capillary: 142 mg/dL — ABNORMAL HIGH (ref 70–99)

## 2024-08-19 LAB — EXPECTORATED SPUTUM ASSESSMENT W GRAM STAIN, RFLX TO RESP C

## 2024-08-19 LAB — CBC
HCT: 41.3 % (ref 39.0–52.0)
Hemoglobin: 13.3 g/dL (ref 13.0–17.0)
MCH: 28.3 pg (ref 26.0–34.0)
MCHC: 32.2 g/dL (ref 30.0–36.0)
MCV: 87.9 fL (ref 80.0–100.0)
Platelets: 335 10*3/uL (ref 150–400)
RBC: 4.7 MIL/uL (ref 4.22–5.81)
RDW: 14.9 % (ref 11.5–15.5)
WBC: 12.6 10*3/uL — ABNORMAL HIGH (ref 4.0–10.5)
nRBC: 0.2 % (ref 0.0–0.2)

## 2024-08-19 MED ORDER — ROSUVASTATIN CALCIUM 20 MG PO TABS: 40.0000 mg | ORAL_TABLET | Freq: Every day | ORAL | Status: DC

## 2024-08-19 MED ORDER — MORPHINE SULFATE (PF) 2 MG/ML IV SOLN
2.0000 mg | INTRAVENOUS | Status: AC | PRN
  Administered 2024-08-19 – 2024-08-22 (×6): 2 mg via INTRAVENOUS
  Filled 2024-08-19 (×6): qty 1

## 2024-08-19 NOTE — Progress Notes (Signed)
 "  NAME:  Zachary Heath, MRN:  [id number], DOB:  February 29, 1946, LOS: 8 ADMISSION DATE:  08/10/2024, CONSULTATION DATE:  08/11/24 REFERRING MD:  Dr. Francesco , CHIEF COMPLAINT:  SOB   History of Present Illness:   58 yoM with hx of former smoker, asthma, Parkinson's disease, CAD s/p CABG, CKD3b, hypothyroidism, HTN, GERD Presented to ER 1/25 after development over last 24hrs of multiple falls at home, found down on floor for ~9hrs overnight, productive cough with yellowish sputum, fever, chills, generalized weakness, rhinorrhea.  Also recent poor PO intake over several days.  Lives with daughter.  In ER, found febrile 103 rectal, tachypneic, 95% on RA.  On workup, found to be Flu A positive and CXR with bilateral infrahilar opacities suspicious for aspiration.  Lactic reassuring, PCT 0.32, WBC 11.1, sCr 1.6> 1.42, troponin 111> 117, pBNP 2711, CK 4670.   CTH neg for acute intracranial abnormality, CT cspine neg, AXR with dilated bowel loop concerning for possible ileus vs obstruction.  Started on empiric doxycycline , ceftriaxone  and tamiflu  and admitted to IMTS.   Noted to having increasing respiratory distress requiring NTS this am therefore PCCM consulted.  More hypertensive in which IMTS is adjusting his home medications.  Remains on 3L [place] at 94-100%.  Pt currently reports he is feeling better, currently denies any pain, SOB, N/V/D, abd pain.   1/28 pccm reconsulted due to increasing sob on HHFNC 100% fio2.  Pertinent  Medical History   Past Medical History:  Diagnosis Date   Barrett's esophagus    Coronary artery disease    GERD (gastroesophageal reflux disease)    Glossodynia    Hypertension    Parkinson's disease (HCC)    Significant Hospital Events: Including procedures, antibiotic start and stop dates in addition to other pertinent events   1/25 admit ITMS 1/28 worsening respiratory distress and increase o2 requirements on HHFNC; transfer to icu and intubated  1/30 patient spiked  fever with Tmax 100.5, sedation was decreased to try pressure support trial 1/31 patient was extubated, requiring 15 L oxygen, tachypneic, afebrile 2/1 NPO after SLP, HFNC 15> 10L, trach asp> abundant Candida parapsilosis, micafungin  started 2/3 lateral transfer to 2H   Interim History / Subjective:  Low grade temp overnight  HHFNC 60% 50 lpm   Sputum sent 2/2 not acceptable for testing  Objective    Blood pressure 113/62, pulse 84, temperature 98.7 F (37.1 C), temperature source Oral, resp. rate (!) 44, height 5' 7 (1.702 m), weight 71.2 kg, SpO2 94%.    FiO2 (%):  [40 %-65 %] 65 %   Intake/Output Summary (Last 24 hours) at 08/19/2024 1238 Last data filed at 08/19/2024 1147 Gross per 24 hour  Intake 2800 ml  Output 3270 ml  Net -470 ml   Filed Weights   08/15/24 0500 08/16/24 0500 08/17/24 0500  Weight: 70.5 kg 67.5 kg 71.2 kg     Physical exam: General:  acutely ill appearing elderly M  Neuro: Awake, following commands  HEENT: [place]. NGT in place. HHFNC in place. Anicteric sclera  CV: s1s2 cap refill brisk  PULM:  coarse bilat sounds, rates are incr.  GI: soft abdomen non tender  Extremities: no obvious acute joint deformity  Skin: clean dry   Patient Lines/Drains/Airways Status     Active Line/Drains/Airways     Name Placement date Placement time Site Days   Peripheral IV 08/17/24 20 G 1.25 Anterior;Left;Upper Arm 08/17/24  2237  Arm  2   NG/OG Vented/Dual Lumen Left nare  External length of tube 65 cm 08/13/24  1746  Left nare  6   Urethral Catheter Marissa Double-lumen 16 Fr. 08/13/24  1816  Double-lumen  6           Resolved problem list  Hypoglycemia  Assessment and Plan    Acute hypoxic resp failure Flu A PNA Superimposed bacterial/fungal pna -abundant candida parapsilosis  Possible aspiration event  Dysphagia Asthma P -remains in ICU w high reintubation risk  -candida was reported d/w ID who rec 7d mica  -cont cefepime  -resend sputum -HHFNC  -- wean as able  -NPO  -cont bronchodilators  -cont pulm hygiene   Aki on CKD 3b, improving HyperNa P -cont fwf -follow renal indices uop   Cirrhosis Elevated LFTs -etoh, critical illness P -PRN LFTs   Ground level fall with prolonged downtime - PT/ OT when able --resp status precludes at present   Parkinson's Disease  - sinemet  - PT/ OT  HTN CAD s/p prior CABG Elevated Troponin -ASA -metop, norvasc , PRN hydral -statin held w elevated LFTs, recent elevated CK   Hypothyroidism  - cont levothyroxine   At risk for malnutrition Diarrhea -EN via NGT -cortrak 2/4   GOC DNR discussion -d/w pt this morning re goals of care, desire should his resp status decline, wishes should his heart stop. He just wants to take it day by day -later spoke w pt daughter who is hoping that her father will make his own decision. Discussed difficulty with concept of taking things like this day by day-- though I appreciate the sentiment -- as these decisions are decisions often during emergencies. Conveyed same worry others have re his deconditioning, possibility that he would not tolerate reintubation.   -palliative care is following    Labs   CBC: Recent Labs  Lab 08/14/24 0506 08/16/24 0448 08/17/24 0333 08/18/24 0315 08/19/24 0625  WBC 9.3 5.3 10.3 11.8* 12.6*  NEUTROABS 7.2  --   --   --   --   HGB 14.2 13.9 15.1 13.2 13.3  HCT 44.3 42.5 47.4 40.5 41.3  MCV 88.8 88.9 89.3 88.4 87.9  PLT 152 211 293 320 335    Basic Metabolic Panel: Recent Labs  Lab 08/13/24 1154 08/14/24 0506 08/15/24 0549 08/16/24 0448 08/17/24 0333 08/18/24 0315 08/19/24 0625  NA  --  144 150* 148* 149* 151* 149*  K  --  3.6 3.2* 4.8 4.7 4.2 4.1  CL  --  110 112* 115* 116* 116* 113*  CO2  --  23 26 21* 22 24 27   GLUCOSE  --  157* 124* 203* 139* 150* 143*  BUN  --  50* 51* 58* 62* 54* 45*  CREATININE  --  2.66* 1.96* 1.66* 1.54* 1.35* 1.27*  CALCIUM   --  9.1 9.0 8.6* 8.9 8.7* 8.7*  MG 2.2  2.5* 2.1 2.1  --   --   --   PHOS 3.6 1.8* 2.3* 1.8* 3.4  --   --    GFR: Estimated Creatinine Clearance: 43.4 mL/min (A) (by C-G formula based on SCr of 1.27 mg/dL (H)). Recent Labs  Lab 08/16/24 0448 08/17/24 0333 08/18/24 0315 08/19/24 0625  WBC 5.3 10.3 11.8* 12.6*    Liver Function Tests: Recent Labs  Lab 08/15/24 0549 08/16/24 0448 08/17/24 0333 08/18/24 0315 08/19/24 0625  AST 261* 149* 134* 108* 117*  ALT 99* 113* 104* 113* 151*  ALKPHOS 196* 219* 216* 187* 205*  BILITOT 0.4 0.3 0.2 0.4 0.5  PROT 6.2* 5.9* 6.1*  5.5* 5.9*  ALBUMIN 2.7* 2.6* 2.7* 2.5* 2.6*   No results for input(s): LIPASE, AMYLASE in the last 168 hours. No results for input(s): AMMONIA in the last 168 hours.  ABG    Component Value Date/Time   PHART 7.343 (L) 08/13/2024 1057   PCO2ART 41.8 08/13/2024 1057   PO2ART 123 (H) 08/13/2024 1057   HCO3 22.6 08/13/2024 1057   TCO2 24 08/13/2024 1057   ACIDBASEDEF 3.0 (H) 08/13/2024 1057   O2SAT 99 08/13/2024 1057     Coagulation Profile: No results for input(s): INR, PROTIME in the last 168 hours.  Cardiac Enzymes: Recent Labs  Lab 08/13/24 0124  CKTOTAL 938*    HbA1C: No results found for: HGBA1C  CBG: Recent Labs  Lab 08/18/24 2008 08/18/24 2350 08/19/24 0406 08/19/24 0755 08/19/24 1127  GLUCAP 118* 120* 116* 132* 114*   Allergies Allergies[1]   CRITICAL CARE Performed by: Ronnald FORBES Gave   Total critical care time: 39 minutes  Critical care time was exclusive of separately billable procedures and treating other patients. Critical care was necessary to treat or prevent imminent or life-threatening deterioration.  Critical care was time spent personally by me on the following activities: development of treatment plan with patient and/or surrogate as well as nursing, discussions with consultants, evaluation of patient's response to treatment, examination of patient, obtaining history from patient or surrogate,  ordering and performing treatments and interventions, ordering and review of laboratory studies, ordering and review of radiographic studies, pulse oximetry and re-evaluation of patient's condition.   Ronnald Gave MSN, AGACNP-BC [facility] Amion for pager  08/19/2024, 12:38 PM      [1]  Allergies Allergen Reactions   Penicillin G Anaphylaxis   "

## 2024-08-19 NOTE — IPAL (Signed)
" °  Interdisciplinary Goals of Care Family Meeting   Date carried out: 08/19/2024  Location of the meeting: Bedside  Member's involved: Nurse Practitioner, Bedside Registered Nurse, Family Member or next of kin, and Palliative care team member  Durable Power of Attorney or acting medical decision maker: self, daughter if unable     Discussion: We (myself, [name] NP, the pts daughter and pts primary RN today Carolyn) discussed goals of care for Zachary Heath . His respiratory status continues to be poor. We discussed very transparently that this is multifactorial with his pulmonary disease, as well as physical deconditioning.  We talked about the difficult journey of critical illness, and the frustrations of declining when previously progress was made.  In talking about re-intubation as well as code status, he shares the sentiment of taking things one day at a time and his optimism for improvement We talked about the scope of care we are currently providing --and that while we too hope he will start to improve, we recognize signs of decline and need to plan for what the medical team should/should not do in the event that his respiratory status further deteriorated or if he had a cardiac/respiratory arrest.  After discussing concerns regarding low likelihood of meaningful benefit from interventions such as intubation or resuscitation, the patient reached a decision to change code status to DNR/I.    We will otherwise continue with  current lines of treatment.      Code status:   Code Status: Limited: Do not attempt resuscitation (DNR) -DNR-LIMITED -Do Not Intubate/DNI    Disposition: Continue current acute care  Time spent for the meeting:     Zachary FORBES Gave, NP  08/19/2024, 2:18 PM   "

## 2024-08-19 NOTE — Progress Notes (Signed)
 Palliative:  HPI: 79 y.o. male  with past medical history of Parkinson's disease, CAD s/p CABG (2022), CKD 3b, asthma, hypothyroidism, GERD, Barrett's esophagus, and HTN.  He presented to the ED on 08/10/2024 after multiple falls at home and was found down on the floor.  Also with productive cough, fever, chills, generalized weakness, and poor p.o. intake. He was found to be flu a positive.  He is admitted with acute hypoxic respiratory failure and sepsis secondary to pneumonia (aspiration versus viral). On 1/28, he developed worsening respiratory distress and required intubation. Extubated 1/31. Ongoing high oxygen needs. Palliative Medicine has been consulted for goals of care discussions and complex medical decision making.   I met today at [name] bedside along with his daughter Zachary Heath and PCCM NP Zachary Heath. We had a long conversation explaining the burdens of critical illness and prolong hospitalization. We discussed his poor progression with pneumonia still requiring high oxygen needs as well as deconditioning. We discussed the limitations of medicine and that we do not have fixes for everything. We discussed in detail expectations of intubation and CPR. We discussed recommendations that we do not pursue repeat intubation or CPR. We discussed this in the context of [name] strong faith. This was a very difficult decision for [name] but he ultimately understands and agrees with DNR/DNI. He is still hopeful for improvement and is placing his trust in God. We did discuss and he confirms that he is at peace if he does not survive this illness - I am ready to go. We did discuss use of comfort medications to alleviate suffering in the event of further decline and limited options. Both [name] and Zachary Heath express understanding.   Exam: Awake, alert, oriented. HHFNC 50L 65%. More tachypneic, dyspneic today. Abd soft. NGT with TF.   Plan: - DNR/DNI decided - Ongoing palliative support - Will  add PRN morphine  for relief of shortness of breath/resp distress if needed  40 min  Zachary Kitty, NP Palliative Medicine Team Pager [phone] (Please see [email or web address] for schedule) Team Phone [phone]

## 2024-08-19 NOTE — Progress Notes (Signed)
 PT Cancellation Note  Patient Details Name: Audie Stayer MRN: [id number] DOB: 09-26-1945   Cancelled Treatment:    Reason Eval/Treat Not Completed: Patient not medically ready at this time. Pt on HHFNC 50L with 65% FiO2 and RR 40-50. Discussed with RN and will hold at this time and check back as appropriate throughout the week.   Izetta Call, PT, DPT   Acute Rehabilitation Department Office [phone] Secure Chat Communication Preferred   Izetta JULIANNA Call 08/19/2024, 1:10 PM

## 2024-08-19 NOTE — Progress Notes (Signed)
 HHFNC transported from 4N to 7Y81 without complications

## 2024-08-20 ENCOUNTER — Inpatient Hospital Stay (HOSPITAL_COMMUNITY)

## 2024-08-20 DIAGNOSIS — G20C Parkinsonism, unspecified: Secondary | ICD-10-CM | POA: Diagnosis not present

## 2024-08-20 DIAGNOSIS — R7989 Other specified abnormal findings of blood chemistry: Secondary | ICD-10-CM | POA: Diagnosis not present

## 2024-08-20 DIAGNOSIS — J9601 Acute respiratory failure with hypoxia: Secondary | ICD-10-CM | POA: Diagnosis not present

## 2024-08-20 DIAGNOSIS — J09X1 Influenza due to identified novel influenza A virus with pneumonia: Secondary | ICD-10-CM | POA: Diagnosis not present

## 2024-08-20 DIAGNOSIS — N1832 Chronic kidney disease, stage 3b: Secondary | ICD-10-CM | POA: Diagnosis not present

## 2024-08-20 DIAGNOSIS — R1310 Dysphagia, unspecified: Secondary | ICD-10-CM | POA: Diagnosis not present

## 2024-08-20 DIAGNOSIS — K7030 Alcoholic cirrhosis of liver without ascites: Secondary | ICD-10-CM | POA: Diagnosis not present

## 2024-08-20 DIAGNOSIS — N179 Acute kidney failure, unspecified: Secondary | ICD-10-CM | POA: Diagnosis not present

## 2024-08-20 DIAGNOSIS — R7401 Elevation of levels of liver transaminase levels: Secondary | ICD-10-CM | POA: Diagnosis not present

## 2024-08-20 DIAGNOSIS — E039 Hypothyroidism, unspecified: Secondary | ICD-10-CM | POA: Diagnosis not present

## 2024-08-20 DIAGNOSIS — I10 Essential (primary) hypertension: Secondary | ICD-10-CM | POA: Diagnosis not present

## 2024-08-20 DIAGNOSIS — I2510 Atherosclerotic heart disease of native coronary artery without angina pectoris: Secondary | ICD-10-CM | POA: Diagnosis not present

## 2024-08-20 LAB — GLUCOSE, CAPILLARY
Glucose-Capillary: 105 mg/dL — ABNORMAL HIGH (ref 70–99)
Glucose-Capillary: 114 mg/dL — ABNORMAL HIGH (ref 70–99)
Glucose-Capillary: 114 mg/dL — ABNORMAL HIGH (ref 70–99)
Glucose-Capillary: 120 mg/dL — ABNORMAL HIGH (ref 70–99)
Glucose-Capillary: 123 mg/dL — ABNORMAL HIGH (ref 70–99)
Glucose-Capillary: 128 mg/dL — ABNORMAL HIGH (ref 70–99)

## 2024-08-20 LAB — BASIC METABOLIC PANEL WITH GFR
Anion gap: 7 (ref 5–15)
BUN: 41 mg/dL — ABNORMAL HIGH (ref 8–23)
CO2: 25 mmol/L (ref 22–32)
Calcium: 8.2 mg/dL — ABNORMAL LOW (ref 8.9–10.3)
Chloride: 108 mmol/L (ref 98–111)
Creatinine, Ser: 1.06 mg/dL (ref 0.61–1.24)
GFR, Estimated: >60 mL/min
Glucose, Bld: 124 mg/dL — ABNORMAL HIGH (ref 70–99)
Potassium: 4.4 mmol/L (ref 3.5–5.1)
Sodium: 140 mmol/L (ref 135–145)

## 2024-08-20 LAB — CBC
HCT: 40.1 % (ref 39.0–52.0)
Hemoglobin: 12.7 g/dL — ABNORMAL LOW (ref 13.0–17.0)
MCH: 28.6 pg (ref 26.0–34.0)
MCHC: 31.7 g/dL (ref 30.0–36.0)
MCV: 90.3 fL (ref 80.0–100.0)
Platelets: 325 10*3/uL (ref 150–400)
RBC: 4.44 MIL/uL (ref 4.22–5.81)
RDW: 15.1 % (ref 11.5–15.5)
WBC: 10.1 10*3/uL (ref 4.0–10.5)
nRBC: 0.2 % (ref 0.0–0.2)

## 2024-08-20 MED ORDER — FREE WATER
300.0000 mL | Freq: Three times a day (TID) | Status: DC
  Administered 2024-08-20 – 2024-08-22 (×5): 300 mL

## 2024-08-20 MED ORDER — FUROSEMIDE 10 MG/ML IJ SOLN
60.0000 mg | Freq: Once | INTRAMUSCULAR | Status: AC
  Administered 2024-08-20: 60 mg via INTRAVENOUS
  Filled 2024-08-20: qty 6

## 2024-08-20 NOTE — Progress Notes (Signed)
 Palliative:  HPI: 79 y.o. 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 Required intubation 8/12.   I met today with 3 daughters, sister, brother, and Inge Lecher NP PCCM. We had discussion regarding path forward for one way extubation. We reviewed poor prognosis and expectation that JD will not do well once extubated. Family reiterate goal that he not suffer. We reviewed option to have medication available if he is having distress vs having medication already onboard to prevent distress. Family would like to have medication onboard to minimize any suffering during transition off ventilator. They would like to move forward with extubation later today and will have the rest of the family come to visit prior to extubation.   I reviewed plans and symptom management recommendations with RN.   Update: I was present with family prior to additional support as well as throughout extubation. Family appropriately tearful. Assisted to ensure comfort. I left family to visit privately with JD after he is resting comfortably after extubation.   All questions/concerns addressed. Emotional support provided. Much time coordinating care with RN, PCCM, and family.   Exam: Sedated on vent. FiO2 50%. Breathing regular, unlabored on vent. No distress. Not following commands. Abd soft. Warm to touch.   Plan:  - DNR - Extubated to full comfort care - Anticipate hospital death  100 min  [name], NP Palliative Medicine Team Pager [phone] (Please see [email or web address] for schedule) Team Phone [phone]

## 2024-08-20 NOTE — Progress Notes (Addendum)
 "  NAME:  Zachary Heath, MRN:  [id number], DOB:  1946-06-03, LOS: 9 ADMISSION DATE:  08/10/2024, CONSULTATION DATE:  08/11/24 REFERRING MD:  Dr. Francesco , CHIEF COMPLAINT:  SOB   History of Present Illness:   79 yoM with hx of former smoker, asthma, Parkinson's disease, CAD s/p CABG, CKD3b, hypothyroidism, HTN, GERD Presented to ER 1/25 after development over last 24hrs of multiple falls at home, found down on floor for ~9hrs overnight, productive cough with yellowish sputum, fever, chills, generalized weakness, rhinorrhea.  Also recent poor PO intake over several days.  Lives with daughter.  In ER, found febrile 103 rectal, tachypneic, 95% on RA.  On workup, found to be Flu A positive and CXR with bilateral infrahilar opacities suspicious for aspiration.  Lactic reassuring, PCT 0.32, WBC 11.1, sCr 1.6> 1.42, troponin 111> 117, pBNP 2711, CK 4670.   CTH neg for acute intracranial abnormality, CT cspine neg, AXR with dilated bowel loop concerning for possible ileus vs obstruction.  Started on empiric doxycycline , ceftriaxone  and tamiflu  and admitted to IMTS.   Noted to having increasing respiratory distress requiring NTS this am therefore PCCM consulted.  More hypertensive in which IMTS is adjusting his home medications.  Remains on 3L [place] at 94-100%.  Pt currently reports he is feeling better, currently denies any pain, SOB, N/V/D, abd pain.   1/28 pccm reconsulted due to increasing sob on HHFNC 100% fio2.  Pertinent  Medical History   Past Medical History:  Diagnosis Date   Barrett's esophagus    Coronary artery disease    GERD (gastroesophageal reflux disease)    Glossodynia    Hypertension    Parkinson's disease (HCC)    Significant Hospital Events: Including procedures, antibiotic start and stop dates in addition to other pertinent events   1/25 admit ITMS 1/28 worsening respiratory distress and increase o2 requirements on HHFNC; transfer to icu and intubated  1/30 patient spiked  fever with Tmax 100.5, sedation was decreased to try pressure support trial 1/31 patient was extubated, requiring 15 L oxygen, tachypneic, afebrile 2/1 NPO after SLP, HFNC 15> 10L, trach asp> abundant Candida parapsilosis, micafungin  started 2/3 lateral transfer to 2H, increased HHFNC requirements, sputum sent, IPAL made DNR/DNI  Interim History / Subjective:  No acute events overnight, continues to be tachypneic.  Will trial some Lasix  today to see if this improves his breathing at all, unfortunately I feel like we are moving towards comfort. Objective    Blood pressure 134/72, pulse 90, temperature 98 F (36.7 C), temperature source Oral, resp. rate (!) 35, height 5' 7 (1.702 m), weight 66.3 kg, SpO2 94%.    FiO2 (%):  [60 %-65 %] 65 %   Intake/Output Summary (Last 24 hours) at 08/20/2024 1000 Last data filed at 08/20/2024 0800 Gross per 24 hour  Intake 2840 ml  Output 2545 ml  Net 295 ml   Filed Weights   08/16/24 0500 08/17/24 0500 08/20/24 0220  Weight: 67.5 kg 71.2 kg 66.3 kg     Physical exam: General: Older male, laying in bed, acute on chronically ill-appearing Neuro: Awake and alert, able to tell me his name HEENT: Anicteric sclera, mucous membranes are moist CV: S1-S2, no murmur, extremities are warm with pitting edema PULM: Diminished bases, faint crackles bilaterally, labored breathing HHFNC 60%, 55 L GI: + BS, soft Extremities: Warm, pitting edema in bilateral ankles and feet Skin: Warm and intact  Patient Lines/Drains/Airways Status     Active Line/Drains/Airways     Name  Placement date Placement time Site Days   Peripheral IV 08/17/24 20 G 1.25 Anterior;Left;Upper Arm 08/17/24  2237  Arm  3   NG/OG Vented/Dual Lumen Left nare 08/19/24  1930  Left nare  1   Urethral Catheter Marissa Double-lumen 16 Fr. 08/13/24  1816  Double-lumen  7   Wound 08/20/24 0800 Pressure Injury Nose Mid Stage 2 -  Partial thickness loss of dermis presenting as a shallow open injury  with a red, pink wound bed without slough. 08/20/24  0800  Nose  less than 1           Resolved problem list  Hypoglycemia  Assessment and Plan   Acute hypoxic resp failure Flu A PNA Superimposed bacterial/fungal pna -abundant candida parapsilosis  Possible aspiration event  Dysphagia Asthma -candida was reported d/w ID who rec 7d mica  -cont cefepime  -trial 60mg  IV lasix  today to optimize/salvage -repeat CXR this morning  -resend sputum - unable to obtain at current  -[facility] -- wean as able  -NPO  -cont bronchodilators  -cont pulm hygiene   Aki on CKD 3b, improving HyperNa>resolved  P -cont fwf > reduce to 300 q8h as sodium has normalized  -lasix  60mg  IV once  -trend renal function, elytes, output  Cirrhosis Elevated LFTs -etoh, critical illness P -PRN LFTs   Ground level fall with prolonged downtime - PT/ OT when able --resp status precludes at present   Parkinson's Disease  - sinemet  - PT/ OT  HTN CAD s/p prior CABG Elevated Troponin -ASA -metop, norvasc , PRN hydral -statin held w elevated LFTs, recent elevated CK   Hypothyroidism  - cont levothyroxine   At risk for malnutrition Diarrhea -EN via NGT -cortrak 2/4   GOC DNR discussion 2/3: CCM/PMT had long GOC discussion with patient and daughter - eventually came to decision of DNR/DNI; both are still hopeful for improvement but understanding of situation.  - PMT continue to follow  Labs   CBC: Recent Labs  Lab 08/14/24 0506 08/16/24 0448 08/17/24 0333 08/18/24 0315 08/19/24 0625 08/20/24 0231  WBC 9.3 5.3 10.3 11.8* 12.6* 10.1  NEUTROABS 7.2  --   --   --   --   --   HGB 14.2 13.9 15.1 13.2 13.3 12.7*  HCT 44.3 42.5 47.4 40.5 41.3 40.1  MCV 88.8 88.9 89.3 88.4 87.9 90.3  PLT 152 211 293 320 335 325    Basic Metabolic Panel: Recent Labs  Lab 08/13/24 1057 08/13/24 1154 08/14/24 0506 08/15/24 0549 08/16/24 0448 08/17/24 0333 08/18/24 0315 08/19/24 0625  NA  --   --  144  150* 148* 149* 151* 149*  K  --   --  3.6 3.2* 4.8 4.7 4.2 4.1  CL   < >  --  110 112* 115* 116* 116* 113*  CO2   < >  --  23 26 21* 22 24 27   GLUCOSE   < >  --  157* 124* 203* 139* 150* 143*  BUN   < >  --  50* 51* 58* 62* 54* 45*  CREATININE   < >  --  2.66* 1.96* 1.66* 1.54* 1.35* 1.27*  CALCIUM    < >  --  9.1 9.0 8.6* 8.9 8.7* 8.7*  MG  --  2.2 2.5* 2.1 2.1  --   --   --   PHOS  --  3.6 1.8* 2.3* 1.8* 3.4  --   --    < > = values in this interval not displayed.  GFR: Estimated Creatinine Clearance: 43.4 mL/min (A) (by C-G formula based on SCr of 1.27 mg/dL (H)). Recent Labs  Lab 08/17/24 0333 08/18/24 0315 08/19/24 0625 08/20/24 0231  WBC 10.3 11.8* 12.6* 10.1    Liver Function Tests: Recent Labs  Lab 08/15/24 0549 08/16/24 0448 08/17/24 0333 08/18/24 0315 08/19/24 0625  AST 261* 149* 134* 108* 117*  ALT 99* 113* 104* 113* 151*  ALKPHOS 196* 219* 216* 187* 205*  BILITOT 0.4 0.3 0.2 0.4 0.5  PROT 6.2* 5.9* 6.1* 5.5* 5.9*  ALBUMIN 2.7* 2.6* 2.7* 2.5* 2.6*   No results for input(s): LIPASE, AMYLASE in the last 168 hours. No results for input(s): AMMONIA in the last 168 hours.  ABG    Component Value Date/Time   PHART 7.343 (L) 08/13/2024 1057   PCO2ART 41.8 08/13/2024 1057   PO2ART 123 (H) 08/13/2024 1057   HCO3 22.6 08/13/2024 1057   TCO2 24 08/13/2024 1057   ACIDBASEDEF 3.0 (H) 08/13/2024 1057   O2SAT 99 08/13/2024 1057     Coagulation Profile: No results for input(s): INR, PROTIME in the last 168 hours.  Cardiac Enzymes: No results for input(s): CKTOTAL, CKMB, CKMBINDEX, TROPONINI in the last 168 hours.   HbA1C: No results found for: HGBA1C  CBG: Recent Labs  Lab 08/19/24 1547 08/19/24 2029 08/19/24 2355 08/20/24 0422 08/20/24 0753  GLUCAP 127* 102* 142* 105* 114*   Allergies Allergies[1]   CC time: 38 The patient is critically ill with multiple organ system failure and requires high complexity decision making for  assessment and support, frequent evaluation and titration of therapies, advanced monitoring, review of radiographic studies and interpretation of complex data.    Critical Care Time devoted to patient care services, exclusive of separately billable procedures, described in this note is 60  Tinnie FORBES Adolph DEVONNA [facility] 08/20/24 12:03 PM  Please see [email or web address] for pager details. From 7A-7P if no response, please call [phone]         [1]  Allergies Allergen Reactions   Penicillin G Anaphylaxis   "

## 2024-08-20 NOTE — TOC Progression Note (Signed)
 Transition of Care [facility]) - Progression Note    Patient Details  Name: Zachary Heath MRN: [id number] Date of Birth: 14-Oct-1945  [facility]) CM/SW Contact  Isaiah Public, LCSWA Phone Number: 08/20/2024, 5:02 PM  Clinical Narrative:     TOC continues to follow.    Barriers to Discharge: Continued Medical Work up               Expected Discharge Plan and Services In-house Referral: Clinical Social Work, Hospice / Palliative Care     Living arrangements for the past 2 months: Single Family Home                                       Social Drivers of Health (SDOH) Interventions SDOH Screenings   Food Insecurity: No Food Insecurity (08/12/2024)  Housing: Low Risk (08/12/2024)  Transportation Needs: No Transportation Needs (08/12/2024)  Utilities: Not At Risk (08/12/2024)  Depression (PHQ2-9): Low Risk (11/25/2021)  Social Connections: Unknown (08/12/2024)  Tobacco Use: Low Risk (08/10/2024)    Readmission Risk Interventions     No data to display

## 2024-08-20 NOTE — Progress Notes (Signed)
 Cortrak Tube Team Note:  Consult received to replace and NGT with a Cortrak feeding tube. Attempted cortrak placement with lots of secretions and coughing. Sats dropped, paused placement, pt recovered. Alyssa, RN, present. Previously discussed pt with Dr.Mohammed, if any respiratory issues during placement, told to stop. Pt has NG access that is currently being used. Cortrak order discontinued but tube left at bedside if needed at a later date.   No x-ray is required. RN may begin using tube.   If the tube becomes dislodged please keep the tube and contact the Cortrak team at [email or web address] for replacement.  If after hours and replacement cannot be delayed, place a NG tube and confirm placement with an abdominal x-ray.   Olivia Kenning, RD Registered Dietitian  See Amion for more information

## 2024-08-20 NOTE — Plan of Care (Signed)
  Problem: Coping: Goal: Level of anxiety will decrease Outcome: Progressing   Problem: Activity: Goal: Risk for activity intolerance will decrease Outcome: Not Progressing   

## 2024-08-21 DIAGNOSIS — R197 Diarrhea, unspecified: Secondary | ICD-10-CM

## 2024-08-21 DIAGNOSIS — I2510 Atherosclerotic heart disease of native coronary artery without angina pectoris: Secondary | ICD-10-CM | POA: Diagnosis not present

## 2024-08-21 DIAGNOSIS — K7460 Unspecified cirrhosis of liver: Secondary | ICD-10-CM | POA: Diagnosis not present

## 2024-08-21 DIAGNOSIS — J9601 Acute respiratory failure with hypoxia: Secondary | ICD-10-CM | POA: Diagnosis not present

## 2024-08-21 DIAGNOSIS — G20A1 Parkinson's disease without dyskinesia, without mention of fluctuations: Secondary | ICD-10-CM | POA: Diagnosis not present

## 2024-08-21 DIAGNOSIS — J09X1 Influenza due to identified novel influenza A virus with pneumonia: Secondary | ICD-10-CM | POA: Diagnosis not present

## 2024-08-21 DIAGNOSIS — R7989 Other specified abnormal findings of blood chemistry: Secondary | ICD-10-CM | POA: Diagnosis not present

## 2024-08-21 DIAGNOSIS — R1310 Dysphagia, unspecified: Secondary | ICD-10-CM | POA: Diagnosis not present

## 2024-08-21 DIAGNOSIS — N1832 Chronic kidney disease, stage 3b: Secondary | ICD-10-CM | POA: Diagnosis not present

## 2024-08-21 DIAGNOSIS — R7401 Elevation of levels of liver transaminase levels: Secondary | ICD-10-CM | POA: Diagnosis not present

## 2024-08-21 DIAGNOSIS — J45909 Unspecified asthma, uncomplicated: Secondary | ICD-10-CM | POA: Diagnosis not present

## 2024-08-21 DIAGNOSIS — I10 Essential (primary) hypertension: Secondary | ICD-10-CM | POA: Diagnosis not present

## 2024-08-21 LAB — CBC
HCT: 42.2 % (ref 39.0–52.0)
Hemoglobin: 13.5 g/dL (ref 13.0–17.0)
MCH: 28.3 pg (ref 26.0–34.0)
MCHC: 32 g/dL (ref 30.0–36.0)
MCV: 88.5 fL (ref 80.0–100.0)
Platelets: 369 10*3/uL (ref 150–400)
RBC: 4.77 MIL/uL (ref 4.22–5.81)
RDW: 14.7 % (ref 11.5–15.5)
WBC: 14.3 10*3/uL — ABNORMAL HIGH (ref 4.0–10.5)
nRBC: 0 % (ref 0.0–0.2)

## 2024-08-21 LAB — GLUCOSE, CAPILLARY
Glucose-Capillary: 123 mg/dL — ABNORMAL HIGH (ref 70–99)
Glucose-Capillary: 123 mg/dL — ABNORMAL HIGH (ref 70–99)
Glucose-Capillary: 128 mg/dL — ABNORMAL HIGH (ref 70–99)
Glucose-Capillary: 134 mg/dL — ABNORMAL HIGH (ref 70–99)
Glucose-Capillary: 140 mg/dL — ABNORMAL HIGH (ref 70–99)
Glucose-Capillary: 146 mg/dL — ABNORMAL HIGH (ref 70–99)

## 2024-08-21 LAB — BASIC METABOLIC PANEL WITH GFR
Anion gap: 9 (ref 5–15)
BUN: 46 mg/dL — ABNORMAL HIGH (ref 8–23)
CO2: 27 mmol/L (ref 22–32)
Calcium: 8.9 mg/dL (ref 8.9–10.3)
Chloride: 104 mmol/L (ref 98–111)
Creatinine, Ser: 1.23 mg/dL (ref 0.61–1.24)
GFR, Estimated: 59 mL/min — ABNORMAL LOW
Glucose, Bld: 124 mg/dL — ABNORMAL HIGH (ref 70–99)
Potassium: 5.2 mmol/L — ABNORMAL HIGH (ref 3.5–5.1)
Sodium: 140 mmol/L (ref 135–145)

## 2024-08-21 MED ORDER — PANTOPRAZOLE SODIUM 40 MG IV SOLR
40.0000 mg | Freq: Two times a day (BID) | INTRAVENOUS | Status: AC
  Administered 2024-08-21 – 2024-08-22 (×3): 40 mg via INTRAVENOUS
  Filled 2024-08-21 (×3): qty 10

## 2024-08-21 MED ORDER — FUROSEMIDE 10 MG/ML IJ SOLN
40.0000 mg | Freq: Two times a day (BID) | INTRAMUSCULAR | Status: AC
  Administered 2024-08-21 – 2024-08-22 (×4): 40 mg via INTRAVENOUS
  Filled 2024-08-21 (×4): qty 4

## 2024-08-21 MED ORDER — PANTOPRAZOLE SODIUM 40 MG PO TBEC: 40.0000 mg | DELAYED_RELEASE_TABLET | Freq: Two times a day (BID) | ORAL | Status: DC

## 2024-08-21 NOTE — Plan of Care (Signed)
" °  Problem: Clinical Measurements: Goal: Will remain free from infection Outcome: Progressing Goal: Diagnostic test results will improve Outcome: Progressing Goal: Cardiovascular complication will be avoided Outcome: Progressing   Problem: Nutrition: Goal: Adequate nutrition will be maintained Outcome: Progressing   Problem: Coping: Goal: Level of anxiety will decrease Outcome: Progressing   Problem: Elimination: Goal: Will not experience complications related to bowel motility Outcome: Progressing   Problem: Pain Managment: Goal: General experience of comfort will improve and/or be controlled Outcome: Progressing   Problem: Safety: Goal: Ability to remain free from injury will improve Outcome: Progressing   Problem: Skin Integrity: Goal: Risk for impaired skin integrity will decrease Outcome: Progressing   Problem: Coping: Goal: Ability to adjust to condition or change in health will improve Outcome: Progressing   Problem: Fluid Volume: Goal: Ability to maintain a balanced intake and output will improve Outcome: Progressing   Problem: Metabolic: Goal: Ability to maintain appropriate glucose levels will improve Outcome: Progressing   Problem: Nutritional: Goal: Maintenance of adequate nutrition will improve Outcome: Progressing Goal: Progress toward achieving an optimal weight will improve Outcome: Progressing   Problem: Skin Integrity: Goal: Risk for impaired skin integrity will decrease Outcome: Progressing   Problem: Tissue Perfusion: Goal: Adequacy of tissue perfusion will improve Outcome: Progressing   Problem: Clinical Measurements: Goal: Ability to maintain a body temperature in the normal range will improve Outcome: Progressing   "

## 2024-08-21 NOTE — Progress Notes (Addendum)
 Palliative:  HPI: 79 y.o. male with past medical history of Parkinson's disease, CAD s/p CABG (2022), CKD 3b, asthma, hypothyroidism, GERD, Barrett's esophagus, and HTN. He presented to the ED on 08/10/2024 after multiple falls at home and was found down on the floor. Also with productive cough, fever, chills, generalized weakness, and poor p.o. intake. He was found to be flu a positive. He is admitted with acute hypoxic respiratory failure and sepsis secondary to pneumonia (aspiration versus viral). On 1/28, he developed worsening respiratory distress and required intubation. Extubated 1/31. Ongoing high oxygen needs. 2/5 More fatigue and appears to be tiring out. Palliative Medicine has been consulted for goals of care discussions and complex medical decision making.   I discussed with RN. I discussed with Dr. Gatha. I met today with Zachary Heath as well along with his daughter, Zachary Heath, who arrived today from . Zachary Heath's daughter is en-route from [place]. Zachary Heath has has good understanding of the severity of her father's illness. I updated her on our conversations and his decision for DNR/DNI. She agrees with this decision. She reports multiple conversations that they have had together. She does report that she did ask him to hold on until she got here. She speaks with her father about the option for comfort care. I explained further to Zachary Heath that he is requiring ongoing high oxygen needs. I explained how this can become a life support. I explained how this can become a form of prolonging suffering. I explained that we do not want to do things to prolong suffering but interventions that could help him. I empowered him to please let us  know if he is prepared for comfort focused care and not pursuing these life prolonging interventions (HHFNC). I reassured him that we would provide him with medications to provide him relief from shortness of breath, pain, and anxiety. I explained that we just want to ensure  that he is cared for the best way possible. He nods in understanding. He does not have the reserve for conversation today. We encouraged him to consider his options and to let us  know his wishes when he is ready.   I discussed further with Zachary Heath. I explained to Zachary Heath that he appears much declined from yesterday. I explained that he very well may decline and require comfort care before he makes that decision himself. Zachary Heath understands. She believes he has been waiting for her and family to get here. She is supportive of comfort care. She and Zachary Heath (HCPOA) both support his wishes and want him to have quality of life and do not want him to suffer.   All questions/concerns addressed. Emotional support provided. Updated RN and PCCM PA Zachary Heath.   Exam: Awake, alert. Less verbal today - more labored. Poor reserve. HHFNC 45L 50%. Abd soft. Warm to touch.   Plan: - DNR/DNI - Continue current measures - Comfort care if he declines further - Patient and family are considering transition to comfort care  35 min  Zachary Kitty, NP Palliative Medicine Team Pager [phone] (Please see [email or web address] for schedule) Team Phone [phone]

## 2024-08-21 NOTE — Evaluation (Signed)
 Physical Therapy Re-Evaluation Patient Details Name: Zachary Heath MRN: [id number] DOB: 07/05/1946 Today's Date: 08/21/2024  History of Present Illness  Patient is a 79 yo male presenting to the ED with falls, cough, and SOB on 08/10/24. Pt admitted with Flu A PNA. Rapid response called 1/27 for respiratory distress. ETT 1/28-1/31. PMHx of Parkinson's disease, CAD s/p CABG (2022), CKD3b, hypothyroidism, asthma, HTN and GERD   Clinical Impression  The pt and his daughter were agreeable to session and expressed desire to try to work with PT this date. The pt has had a medical and functional decline since his last PT session, thus conducted a PT Re-Evaluation. Limited this session to bed level activities/exercises as the pt has a varying RR up to 50 while on HHFNC with just gentle movements or the HOB being elevated. Closely monitored his vitals and paused activity when his RR increased to the 40s to maximize his safety with gentle mobility. Limited session to just raising the [facility] and placing pt with the bed in egress position to try to improve his pulmonary function and ability to expel mucous when coughing. He tolerated the bed being in egress position for ~2 min before requesting his HOB to be lowered to 45' due to increased work of breathing with full egress position. He reported being comfortable in general and with breathing with HOB at a 45' incline. Performed a couple lower extremity AAROM exercises while in this position to his tolerance, but he fatigues quickly. He displays deficits in pulmonary function, activity tolerance, and generalized strength. The pt and his family are continuing GOC discussions but are currently preferring to continue current measures/interventions. Will continue to follow acutely. If pt makes good progress and becomes medically stable for transfer out of the hospital, he would likely benefit from inpatient rehab, < 3 hours/day.       If plan is discharge home,  recommend the following: Two people to help with walking and/or transfers;A lot of help with bathing/dressing/bathroom;Assistance with cooking/housework;Assist for transportation;Help with stairs or ramp for entrance   Can travel by private vehicle   No    Equipment Recommendations BSC/3in1;Hospital bed;Hoyer lift  Recommendations for Other Services       Functional Status Assessment Patient has had a recent decline in their functional status and demonstrates the ability to make significant improvements in function in a reasonable and predictable amount of time.     Precautions / Restrictions Precautions Precautions: Other (comment);Fall Recall of Precautions/Restrictions: Intact Precaution/Restrictions Comments: watch HR, RR, O2; on HHFNC Restrictions Weight Bearing Restrictions Per Provider Order: No      Mobility  Bed Mobility Overal bed mobility: Needs Assistance Bed Mobility: Rolling Rolling: Max assist         General bed mobility comments: Total assist x2 provided to scoot pt superiorly in bed for more optimal placement before placing bed in egress position. Pt tolerated bed in egress position for ~2 min then requested HOB be declined due to noted increased work of breathing. HOB dropped to 45', where pt reported feeling comfortable and where he did not have increased work of breathing. Pt able to maintain this position throughout remainder of session. Pt did roll 1x to his R to place bed pan for potential BM end of session. Deferred further bed mobility due to his variable RR up to 50 with just raising HOB and gentle lower extremity AROM exercises this date.    Transfers  General transfer comment: Deferred OOB mobility due to his variable RR up to 50 with just raising HOB and gentle supine lower extremity AROM exercises this date.    Ambulation/Gait               General Gait Details: Deferred OOB mobility due to his variable RR up to  50 with just raising HOB and gentle supine lower extremity AROM exercises this date.  Stairs            Wheelchair Mobility     Tilt Bed    Modified Rankin (Stroke Patients Only)       Balance Overall balance assessment: Needs assistance Sitting-balance support: No upper extremity supported, Feet unsupported Sitting balance-Leahy Scale: Zero Sitting balance - Comments: Pt relied on bed features to sit him up in bed, tolerating egress position for ~2 min before needing HOB lowered to 45' (pt reported being comfortable at 75' and wanting to stay at that angle).       Standing balance comment: Deferred OOB mobility due to his variable RR up to 50 with just raising HOB and gentle supine lower extremity AROM exercises this date.                             Pertinent Vitals/Pain Pain Assessment Pain Assessment: Faces Faces Pain Scale: Hurts little more Pain Location: generalized Pain Descriptors / Indicators: Discomfort Pain Intervention(s): Limited activity within patient's tolerance, Monitored during session, Repositioned    Home Living Family/patient expects to be discharged to:: Private residence Living Arrangements: Children Available Help at Discharge: Available PRN/intermittently;Family Type of Home: House Home Access: Other (comment) (stair lift)         Home Equipment: Cane - single point;Rolling Walker (2 wheels);Wheelchair - Careers Adviser (comment);Shower seat (stair lift)      Prior Function Prior Level of Function : Independent/Modified Independent             Mobility Comments: No assistive device. ADLs Comments: Mod I for ADL's     Extremity/Trunk Assessment   Upper Extremity Assessment Upper Extremity Assessment: Defer to OT evaluation    Lower Extremity Assessment Lower Extremity Assessment: Generalized weakness (able to extend knees against gravity but <100%, thus quads MMT score of 3- noted)    Cervical / Trunk  Assessment Cervical / Trunk Assessment: Kyphotic  Communication   Communication Communication: No apparent difficulties    Cognition Arousal: Lethargic Behavior During Therapy: WFL for tasks assessed/performed   PT - Cognitive impairments: Difficult to assess Difficult to assess due to: Level of arousal                     PT - Cognition Comments: Pt lethargic but able to arouse with minimal stimulation, but often closes eyes while following commands. Follows basic commands consistently, but with intermittent delay. Following commands: Impaired Following commands impaired: Follows multi-step commands with increased time     Cueing Cueing Techniques: Verbal cues, Tactile cues, Gestural cues     General Comments General comments (skin integrity, edema, etc.): RR 27-50, varying throughout session and monitored closely; paused any exercises or HOB elevation progression when RR increased to 40s and allowed RR to reduce to 20-30s before trying anything further; SpO2 stable on HHFNC 45L 51% FiO2; Educated daughter on A/AAROM exercises as tolerated and as his vitals permits. Daughter works in teacher, music as PUBLIC HOUSE MANAGER, currently studying to be [employer], and verbalized understanding of monitoring vitals and  his risks with activity.    Exercises General Exercises - Lower Extremity Short Arc Quad: AAROM, Both, 5 reps, Supine Hip ABduction/ADduction: AAROM, Both, Other reps (comment), Supine (x2 reps)   Assessment/Plan    PT Assessment Patient needs continued PT services  PT Problem List Decreased strength;Decreased activity tolerance;Decreased balance;Decreased mobility;Cardiopulmonary status limiting activity       PT Treatment Interventions DME instruction;Gait training;Functional mobility training;Therapeutic activities;Therapeutic exercise;Balance training;Patient/family education;Neuromuscular re-education    PT Goals (Current goals can be found in the Care Plan section)  Acute Rehab PT  Goals Patient Stated Goal: agreeable to session, did not state specific goal PT Goal Formulation: With patient/family Time For Goal Achievement: 09/04/24 Potential to Achieve Goals: Fair    Frequency Min 2X/week     Co-evaluation               AM-PAC PT 6 Clicks Mobility  Outcome Measure Help needed turning from your back to your side while in a flat bed without using bedrails?: A Lot Help needed moving from lying on your back to sitting on the side of a flat bed without using bedrails?: Total Help needed moving to and from a bed to a chair (including a wheelchair)?: Total Help needed standing up from a chair using your arms (e.g., wheelchair or bedside chair)?: Total Help needed to walk in hospital room?: Total Help needed climbing 3-5 steps with a railing? : Total 6 Click Score: 7    End of Session Equipment Utilized During Treatment: Oxygen Activity Tolerance: Patient limited by fatigue;Treatment limited secondary to medical complications (Comment) (limited by high RR) Patient left: in bed;with call bell/phone within reach;with bed alarm set;with family/visitor present Nurse Communication: Mobility status;Other (comment) (vitals; limited session; pt on bed pan) PT Visit Diagnosis: Other abnormalities of gait and mobility (R26.89);Muscle weakness (generalized) (M62.81);History of falling (Z91.81);Unsteadiness on feet (R26.81)    Time: 1520-1540 PT Time Calculation (min) (ACUTE ONLY): 20 min   Charges:   PT Evaluation $PT Re-evaluation: 1 Re-eval   PT General Charges $$ ACUTE PT VISIT: 1 Visit         Theo Ferretti, PT, DPT Acute Rehabilitation Services  Office: [phone]   Theo CHRISTELLA Ferretti 08/21/2024, 6:31 PM

## 2024-08-21 NOTE — Progress Notes (Signed)
 "  NAME:  Zachary Heath, MRN:  [id number], DOB:  [birth year], LOS: 10 ADMISSION DATE:  08/10/2024, CONSULTATION DATE:  08/11/24 REFERRING MD:  Dr. Francesco , CHIEF COMPLAINT:  SOB   History of Present Illness:  65 yoM with hx of former smoker, asthma, Parkinson's disease, CAD s/p CABG, CKD3b, hypothyroidism, HTN, GERD.  Presented to ER 1/25 after development over last 24hrs of multiple falls at home, found down on floor for ~9hrs overnight, productive cough with yellowish sputum, fever, chills, generalized weakness, rhinorrhea.  Also recent poor PO intake over several days.  Lives with daughter.   In ER, found febrile 103 rectal, tachypneic, 95% on RA.  On workup, found to be Flu A positive and CXR with bilateral infrahilar opacities suspicious for aspiration.  Lactic reassuring, PCT 0.32, WBC 11.1, sCr 1.6> 1.42, troponin 111> 117, pBNP 2711, CK 4670.   CTH neg for acute intracranial abnormality, CT cspine neg, AXR with dilated bowel loop concerning for possible ileus vs obstruction.  Started on empiric doxycycline , ceftriaxone  and tamiflu  and admitted to IMTS. Noted to having increasing respiratory distress requiring NTS this am therefore PCCM consulted. More hypertensive in which IMTS is adjusting his home medications. Remains on 3L [place] at 94-100%. Pt currently reports he is feeling better, currently denies any pain, SOB, N/V/D, abd pain.    1/28: PCCM reconsulted due to increasing sob on HHFNC 100% fio2.  Pertinent  Medical History   Past Medical History:  Diagnosis Date   Barrett's esophagus    Coronary artery disease    GERD (gastroesophageal reflux disease)    Glossodynia    Hypertension    Parkinson's disease (HCC)    Significant Hospital Events: Including procedures, antibiotic start and stop dates in addition to other pertinent events   1/25 admit ITMS 1/28 worsening respiratory distress and increase o2 requirements on HHFNC; transfer to icu and intubated  1/30 patient spiked  fever with Tmax 100.5, sedation was decreased to try pressure support trial 1/31 patient was extubated, requiring 15 L oxygen, tachypneic, afebrile 2/1 NPO after SLP, HFNC 15> 10L, trach asp> abundant Candida parapsilosis, micafungin  started 2/3 lateral transfer to 2H, increased HHFNC requirements, sputum sent, IPAL made DNR/DNI 2/5: Remains on HHFNC, decreased requirements now at 50%.   Interim History / Subjective:  No acute events overnight, remains tachypneic. Plan for more lasix  today given 4.3L UO yesterday with stable Cr.   Objective    Blood pressure 119/63, pulse (!) 104, temperature 99.3 F (37.4 C), temperature source Oral, resp. rate (!) 44, height 5' 7 (1.702 m), weight 66.9 kg, SpO2 97%.    FiO2 (%):  [50 %-60 %] 50 %   Intake/Output Summary (Last 24 hours) at 08/21/2024 1255 Last data filed at 08/21/2024 1100 Gross per 24 hour  Intake 2049.14 ml  Output 3720 ml  Net -1670.86 ml   Filed Weights   08/17/24 0500 08/20/24 0220 08/21/24 0500  Weight: 71.2 kg 66.3 kg 66.9 kg     Physical exam: General: Older male, laying in bed, acute on chronically ill-appearing Neuro: Awake and alert, follows commands, answers questions appropriately, no focal neuro deficits HEENT: Anicteric sclera, mucous membranes are moist CV: S1-S2, no murmur, extremities are warm with improving edema PULM: Diminished bases, faint crackles bilaterally, labored breathing HHFNC 50%, 45 L GI: + BS, soft Extremities: Warm, edema in bilateral ankles and feet Skin: Warm and intact  Patient Lines/Drains/Airways Status     Active Line/Drains/Airways     Name Placement date  Placement time Site Days   Peripheral IV 08/17/24 20 G 1.25 Anterior;Left;Upper Arm 08/17/24  2237  Arm  4   NG/OG Vented/Dual Lumen Left nare 08/19/24  1930  Left nare  2   Urethral Catheter Marissa Double-lumen 16 Fr. 08/13/24  1816  Double-lumen  8   Wound 08/20/24 0800 Pressure Injury Nose Mid Stage 2 -  Partial thickness loss  of dermis presenting as a shallow open injury with a red, pink wound bed without slough. 08/20/24  0800  Nose  1           Resolved problem list  Hypoglycemia  Assessment and Plan   Acute Hypoxic Respiratory Failure Flu A Pneumonia Superimposed bacterial/fungal pna - Candida Parapsilosis Possible aspiration event  Dysphagia Asthma - Supplemental oxygen to maintain SpO2 >90% - Remains on HHFNC @ 50% - Continue Micafungin  x 7 days per ID - Continue Cefepime  for 7 day course - Start Lasix  40mg  BID - Resent sputum yesterday - Bronchodilators - Ensure adequate pulmonary hygiene - NPO for now  Hypertension CAD s/p prior CABG Elevated Troponin suspected s/t Demand Ischemia - Continuous cardiac monitoring - Vasopressors to maintain MAP goal >65 ~ not requiring - Continue ASA, metoprolol  and amlodipine  - Hydralazine  prn - Holding statin d/t LFT elevation  AKI on CKD 3b~ Improving Hypernatremia ~ Resolved - Strict I/Os - Inform provider if UO is < 0.5 mL/kg/h - Trend BMP, Mg and Phosphorus - Lasix  40mg  BID - Avoid nephrotoxins as able - Ensure adequate renal perfusion - Correct electrolytes as indicated - Continue free water  flushes at 300 q8h  Cirrhosis Transaminitis Diarrhea Hx: ETOH use - Monitor hepatic function - Constipation protocol prn - GI prophylaxis: Protonix  40mg  BID - Cortrak placed 2/4  Ground level fall with prolonged downtime - PT/ OT when able --resp status precludes at present   Parkinson's Disease  - Continue Sinemet  - PT/ OT  Hyperglycemia Hypothyroidism - ICU hypo/hyperglycemia protocol - CBG q4h - SSI: Novolog  - Goal Range: 140-180 - Continue levothyroxine    Labs   CBC: Recent Labs  Lab 08/17/24 0333 08/18/24 0315 08/19/24 0625 08/20/24 0231 08/21/24 0232  WBC 10.3 11.8* 12.6* 10.1 14.3*  HGB 15.1 13.2 13.3 12.7* 13.5  HCT 47.4 40.5 41.3 40.1 42.2  MCV 89.3 88.4 87.9 90.3 88.5  PLT 293 320 335 325 369    Basic  Metabolic Panel: Recent Labs  Lab 08/15/24 0549 08/16/24 0448 08/17/24 0333 08/18/24 0315 08/19/24 0625 08/20/24 1033 08/21/24 1037  NA 150* 148* 149* 151* 149* 140 140  K 3.2* 4.8 4.7 4.2 4.1 4.4 5.2*  CL 112* 115* 116* 116* 113* 108 104  CO2 26 21* 22 24 27 25 27   GLUCOSE 124* 203* 139* 150* 143* 124* 124*  BUN 51* 58* 62* 54* 45* 41* 46*  CREATININE 1.96* 1.66* 1.54* 1.35* 1.27* 1.06 1.23  CALCIUM  9.0 8.6* 8.9 8.7* 8.7* 8.2* 8.9  MG 2.1 2.1  --   --   --   --   --   PHOS 2.3* 1.8* 3.4  --   --   --   --    GFR: Estimated Creatinine Clearance: 44.8 mL/min (by C-G formula based on SCr of 1.23 mg/dL). Recent Labs  Lab 08/18/24 0315 08/19/24 0625 08/20/24 0231 08/21/24 0232  WBC 11.8* 12.6* 10.1 14.3*    Liver Function Tests: Recent Labs  Lab 08/15/24 0549 08/16/24 0448 08/17/24 0333 08/18/24 0315 08/19/24 0625  AST 261* 149* 134* 108* 117*  ALT 99*  113* 104* 113* 151*  ALKPHOS 196* 219* 216* 187* 205*  BILITOT 0.4 0.3 0.2 0.4 0.5  PROT 6.2* 5.9* 6.1* 5.5* 5.9*  ALBUMIN 2.7* 2.6* 2.7* 2.5* 2.6*   No results for input(s): LIPASE, AMYLASE in the last 168 hours. No results for input(s): AMMONIA in the last 168 hours.  ABG    Component Value Date/Time   PHART 7.343 (L) 08/13/2024 1057   PCO2ART 41.8 08/13/2024 1057   PO2ART 123 (H) 08/13/2024 1057   HCO3 22.6 08/13/2024 1057   TCO2 24 08/13/2024 1057   ACIDBASEDEF 3.0 (H) 08/13/2024 1057   O2SAT 99 08/13/2024 1057     Coagulation Profile: No results for input(s): INR, PROTIME in the last 168 hours.  Cardiac Enzymes: No results for input(s): CKTOTAL, CKMB, CKMBINDEX, TROPONINI in the last 168 hours.   HbA1C: No results found for: HGBA1C  CBG: Recent Labs  Lab 08/20/24 1955 08/20/24 2340 08/21/24 0436 08/21/24 0758 08/21/24 1118  GLUCAP 114* 120* 134* 128* 123*   Allergies Allergies[1]   CC time: 45 The patient is critically ill with multiple organ system failure and  requires high complexity decision making for assessment and support, frequent evaluation and titration of therapies, advanced monitoring, review of radiographic studies and interpretation of complex data.    Critical Care Time devoted to patient care services, exclusive of separately billable procedures, described in this note is 45  Robet Kim, PA-C [facility] 08/21/24 12:55 PM  Please see [email or web address] for pager details. From 7A-7P if no response, please call [phone]          [1]  Allergies Allergen Reactions   Penicillin G Anaphylaxis   "

## 2024-08-21 NOTE — Plan of Care (Signed)
   Problem: Health Behavior/Discharge Planning: Goal: Ability to manage health-related needs will improve Outcome: Progressing   Problem: Clinical Measurements: Goal: Ability to maintain clinical measurements within normal limits will improve Outcome: Progressing Goal: Will remain free from infection Outcome: Progressing Goal: Diagnostic test results will improve Outcome: Progressing

## 2024-08-22 LAB — GLUCOSE, CAPILLARY
Glucose-Capillary: 128 mg/dL — ABNORMAL HIGH (ref 70–99)
Glucose-Capillary: 123 mg/dL — ABNORMAL HIGH (ref 70–99)
Glucose-Capillary: 145 mg/dL — ABNORMAL HIGH (ref 70–99)
Glucose-Capillary: 144 mg/dL — ABNORMAL HIGH (ref 70–99)
Glucose-Capillary: 123 mg/dL — ABNORMAL HIGH (ref 70–99)
Glucose-Capillary: 112 mg/dL — ABNORMAL HIGH (ref 70–99)
Glucose-Capillary: 110 mg/dL — ABNORMAL HIGH (ref 70–99)

## 2024-08-22 LAB — BASIC METABOLIC PANEL WITH GFR
Anion gap: 11 (ref 5–15)
BUN: 49 mg/dL — ABNORMAL HIGH (ref 8–23)
CO2: 27 mmol/L (ref 22–32)
Calcium: 8.9 mg/dL (ref 8.9–10.3)
Chloride: 100 mmol/L (ref 98–111)
Creatinine, Ser: 1.31 mg/dL — ABNORMAL HIGH (ref 0.61–1.24)
GFR, Estimated: 55 mL/min — ABNORMAL LOW
Glucose, Bld: 118 mg/dL — ABNORMAL HIGH (ref 70–99)
Potassium: 4.5 mmol/L (ref 3.5–5.1)
Sodium: 137 mmol/L (ref 135–145)

## 2024-08-22 LAB — CBC
HCT: 42.4 % (ref 39.0–52.0)
Hemoglobin: 13.8 g/dL (ref 13.0–17.0)
MCH: 28.7 pg (ref 26.0–34.0)
MCHC: 32.5 g/dL (ref 30.0–36.0)
MCV: 88.1 fL (ref 80.0–100.0)
Platelets: 401 10*3/uL — ABNORMAL HIGH (ref 150–400)
RBC: 4.81 MIL/uL (ref 4.22–5.81)
RDW: 14.6 % (ref 11.5–15.5)
WBC: 14.9 10*3/uL — ABNORMAL HIGH (ref 4.0–10.5)
nRBC: 0 % (ref 0.0–0.2)

## 2024-08-22 LAB — MAGNESIUM: Magnesium: 2.1 mg/dL (ref 1.7–2.4)

## 2024-08-22 LAB — CULTURE, BLOOD (ROUTINE X 2)
Culture: NO GROWTH
Culture: NO GROWTH
Special Requests: ADEQUATE
Special Requests: ADEQUATE

## 2024-08-22 NOTE — Progress Notes (Signed)
 IMTS will resume care for this patient on 2/7

## 2024-08-22 NOTE — Progress Notes (Signed)
 "  NAME:  Zachary Heath, MRN:  [id number], DOB:  Jan 04, 1946, LOS: 11 ADMISSION DATE:  08/10/2024, CONSULTATION DATE:  08/11/24 REFERRING MD:  Dr. Francesco , CHIEF COMPLAINT:  SOB   History of Present Illness:  73 yoM with hx of former smoker, asthma, Parkinson's disease, CAD s/p CABG, CKD3b, hypothyroidism, HTN, GERD.  Presented to ER 1/25 after development over last 24hrs of multiple falls at home, found down on floor for ~9hrs overnight, productive cough with yellowish sputum, fever, chills, generalized weakness, rhinorrhea.  Also recent poor PO intake over several days.  Lives with daughter.   In ER, found febrile 103 rectal, tachypneic, 95% on RA.  On workup, found to be Flu A positive and CXR with bilateral infrahilar opacities suspicious for aspiration.  Lactic reassuring, PCT 0.32, WBC 11.1, sCr 1.6> 1.42, troponin 111> 117, pBNP 2711, CK 4670.   CTH neg for acute intracranial abnormality, CT cspine neg, AXR with dilated bowel loop concerning for possible ileus vs obstruction.  Started on empiric doxycycline , ceftriaxone  and tamiflu  and admitted to IMTS. Noted to having increasing respiratory distress requiring NTS this am therefore PCCM consulted. More hypertensive in which IMTS is adjusting his home medications. Remains on 3L [place] at 94-100%. Pt currently reports he is feeling better, currently denies any pain, SOB, N/V/D, abd pain.    1/28: PCCM reconsulted due to increasing sob on HHFNC 100% fio2.  Pertinent  Medical History   Past Medical History:  Diagnosis Date   Barrett's esophagus    Coronary artery disease    GERD (gastroesophageal reflux disease)    Glossodynia    Hypertension    Parkinson's disease (HCC)    Significant Hospital Events: Including procedures, antibiotic start and stop dates in addition to other pertinent events   1/25: admit ITMS 1/28: worsening respiratory distress and increase o2 requirements on HHFNC; transfer to icu and intubated  1/30: patient  spiked fever with Tmax 100.5, sedation was decreased to try pressure support trial 1/31: patient was extubated, requiring 15 L oxygen, tachypneic, afebrile 2/1: NPO after SLP, HFNC 15> 10L, trach asp> abundant Candida parapsilosis, micafungin  started 2/3: lateral transfer to 2H, increased HHFNC requirements, sputum sent, IPAL made DNR/DNI 2/5: Remains on HHFNC, decreased requirements now at 50%.  2/6: No acute overnight events. Did spike a fever of 101.3 this am. Remains of HHFNC at 50%.   Interim History / Subjective:  No acute events overnight, persistently tachypneic. Continue lasix  today given 4.5L UO yesterday. Mild increase in Cr overnight, 1.23 >> 1.31   Objective    Blood pressure (!) 120/58, pulse (!) 106, temperature (!) 101.3 F (38.5 C), temperature source Axillary, resp. rate (!) 36, height 5' 7 (1.702 m), weight 66.5 kg, SpO2 100%.    FiO2 (%):  [50 %] 50 %   Intake/Output Summary (Last 24 hours) at 08/22/2024 0719 Last data filed at 08/22/2024 0600 Gross per 24 hour  Intake 2655 ml  Output 4525 ml  Net -1870 ml   Filed Weights   08/20/24 0220 08/21/24 0500 08/22/24 0431  Weight: 66.3 kg 66.9 kg 66.5 kg   Physical exam: General: Older male, laying in bed, acute on chronically ill-appearing Neuro: Awake and alert, follows commands, answers questions appropriately, no focal neuro deficits HEENT: Anicteric sclera, mucous membranes are moist CV: S1-S2, no murmur, extremities are warm with trace edema PULM: Mildly coarse throughout and diminished bases, labored breathing HHFNC 50%, 45 L GI: + BS, soft, no TTP or distention Extremities: Warm, trace  edema in bilateral ankles and feet Skin: Warm and intact  Patient Lines/Drains/Airways Status     Active Line/Drains/Airways     Name Placement date Placement time Site Days   Peripheral IV 08/17/24 20 G 1.25 Anterior;Left;Upper Arm 08/17/24  2237  Arm  5   NG/OG Vented/Dual Lumen Left nare 08/19/24  1930  Left nare  3    Urethral Catheter Marissa Double-lumen 16 Fr. 08/13/24  1816  Double-lumen  9   Wound 08/20/24 0800 Pressure Injury Nose Mid Stage 2 -  Partial thickness loss of dermis presenting as a shallow open injury with a red, pink wound bed without slough. 08/20/24  0800  Nose  2           Resolved problem list  Hypoglycemia  Assessment and Plan   Acute Hypoxic Respiratory Failure Flu A Pneumonia Superimposed bacterial/fungal pna - Candida Parapsilosis Possible aspiration event  Dysphagia Asthma - Supplemental oxygen to maintain SpO2 >90% - Remains on HHFNC @ 50% - Trend WBC and monitor fever curve - Tylenol  650mg  q4h prn - Continue Micafungin  and Cefepime  x 7 days per ID - Bronchodilators - Ensure adequate pulmonary hygiene - Tube feeds - Morphine  2mg  q3h prn  Hypertension CAD s/p prior CABG Elevated Troponin suspected s/t Demand Ischemia - Continuous cardiac monitoring - Vasopressors to maintain MAP goal >65 ~ not requiring - Continue ASA, metoprolol  25mg  per tube BID and amlodipine  10mg  daily - Hydralazine  prn - Holding statin d/t LFT elevation  AKI on CKD 3b~ Slight increase in Cr overnight Hypernatremia ~ Resolved - Strict I/Os - Inform provider if UO is < 0.5 mL/kg/h - Trend BMP, Mg and Phosphorus - Lasix  40mg  BID - Avoid nephrotoxins as able - Ensure adequate renal perfusion - Correct electrolytes as indicated - Continue free water  flushes at 300 q8h  Cirrhosis Transaminitis Diarrhea ~ Resolved Hx: ETOH use, Barrett's Esophagus - Monitor hepatic function - Constipation protocol prn - GI prophylaxis: Protonix  40mg  BID - Cortrak placed 2/4  Ground level fall with prolonged downtime - PT/ OT when able --resp status precludes at present   Parkinson's Disease  - Continue Sinemet  - PT/ OT  Hyperglycemia Hypothyroidism - ICU hypo/hyperglycemia protocol - CBG q4h - SSI: Novolog  - Goal Range: 140-180 - Continue levothyroxine  per tube   Labs    CBC: Recent Labs  Lab 08/18/24 0315 08/19/24 0625 08/20/24 0231 08/21/24 0232 08/22/24 0353  WBC 11.8* 12.6* 10.1 14.3* 14.9*  HGB 13.2 13.3 12.7* 13.5 13.8  HCT 40.5 41.3 40.1 42.2 42.4  MCV 88.4 87.9 90.3 88.5 88.1  PLT 320 335 325 369 401*    Basic Metabolic Panel: Recent Labs  Lab 08/16/24 0448 08/17/24 0333 08/18/24 0315 08/19/24 0625 08/20/24 1033 08/21/24 1037 08/22/24 0353  NA 148* 149* 151* 149* 140 140 137  K 4.8 4.7 4.2 4.1 4.4 5.2* 4.5  CL 115* 116* 116* 113* 108 104 100  CO2 21* 22 24 27 25 27 27   GLUCOSE 203* 139* 150* 143* 124* 124* 118*  BUN 58* 62* 54* 45* 41* 46* 49*  CREATININE 1.66* 1.54* 1.35* 1.27* 1.06 1.23 1.31*  CALCIUM  8.6* 8.9 8.7* 8.7* 8.2* 8.9 8.9  MG 2.1  --   --   --   --   --  2.1  PHOS 1.8* 3.4  --   --   --   --   --    GFR: Estimated Creatinine Clearance: 42 mL/min (A) (by C-G formula based on SCr of 1.31  mg/dL (H)). Recent Labs  Lab 08/19/24 0625 08/20/24 0231 08/21/24 0232 08/22/24 0353  WBC 12.6* 10.1 14.3* 14.9*    Liver Function Tests: Recent Labs  Lab 08/16/24 0448 08/17/24 0333 08/18/24 0315 08/19/24 0625  AST 149* 134* 108* 117*  ALT 113* 104* 113* 151*  ALKPHOS 219* 216* 187* 205*  BILITOT 0.3 0.2 0.4 0.5  PROT 5.9* 6.1* 5.5* 5.9*  ALBUMIN 2.6* 2.7* 2.5* 2.6*   No results for input(s): LIPASE, AMYLASE in the last 168 hours. No results for input(s): AMMONIA in the last 168 hours.  ABG    Component Value Date/Time   PHART 7.343 (L) 08/13/2024 1057   PCO2ART 41.8 08/13/2024 1057   PO2ART 123 (H) 08/13/2024 1057   HCO3 22.6 08/13/2024 1057   TCO2 24 08/13/2024 1057   ACIDBASEDEF 3.0 (H) 08/13/2024 1057   O2SAT 99 08/13/2024 1057     Coagulation Profile: No results for input(s): INR, PROTIME in the last 168 hours.  Cardiac Enzymes: No results for input(s): CKTOTAL, CKMB, CKMBINDEX, TROPONINI in the last 168 hours.   HbA1C: No results found for: HGBA1C  CBG: Recent Labs   Lab 08/21/24 1118 08/21/24 1623 08/21/24 2104 08/21/24 2350 08/22/24 0414  GLUCAP 123* 140* 123* 146* 123*   Allergies Allergies[1]   CC time: 40 minutes The patient is critically ill with multiple organ system failure and requires high complexity decision making for assessment and support, frequent evaluation and titration of therapies, advanced monitoring, review of radiographic studies and interpretation of complex data.    Critical Care Time devoted to patient care services, exclusive of separately billable procedures, described in this note is 40  Robet Kim, PA-C [facility] 08/22/24 7:19 AM  Please see [email or web address] for pager details. From 7A-7P if no response, please call [phone]           [1]  Allergies Allergen Reactions   Penicillin G Anaphylaxis   "

## 2024-08-22 NOTE — Plan of Care (Signed)
" °  Problem: Coping: Goal: Ability to adjust to condition or change in health will improve Outcome: Progressing   Problem: Fluid Volume: Goal: Ability to maintain a balanced intake and output will improve Outcome: Progressing   Problem: Health Behavior/Discharge Planning: Goal: Ability to manage health-related needs will improve Outcome: Progressing   Problem: Clinical Measurements: Goal: Respiratory complications will improve Outcome: Not Progressing   "

## 2024-08-22 NOTE — Progress Notes (Signed)
 SLP Cancellation Note  Patient Details Name: Zachary Heath MRN: [id number] DOB: 1946/05/20   Cancelled treatment:       Reason Eval/Treat Not Completed: Medical issues which prohibited therapy. Reviewed most recent notes and talked with RN. Per RN, patient very lethargic, tachycardic. She does not feel that he is safe for a swallow evaluation at this time. Reports that family is coming into town and suspects that patient may transition to comfort care. Will plan to f/u next date for plan and SLP needs.   Rea Pass MA, CCC-SLP    Deshawnda Acrey Meryl 08/22/2024, 9:25 AM

## 2024-08-22 NOTE — TOC Progression Note (Signed)
 Transition of Care [facility]) - Progression Note    Patient Details  Name: Zachary Heath MRN: [id number] Date of Birth: October 22, 1945  Transition of [facility] CM/SW Contact  Isaiah Public, LCSWA Phone Number: 08/22/2024, 4:18 PM  Clinical Narrative:     TOC will continue to follow.    Barriers to Discharge: Continued Medical Work up               Expected Discharge Plan and Services In-house Referral: Clinical Social Work, Hospice / Palliative Care     Living arrangements for the past 2 months: Single Family Home                                       Social Drivers of Health (SDOH) Interventions SDOH Screenings   Food Insecurity: No Food Insecurity (08/12/2024)  Housing: Low Risk (08/12/2024)  Transportation Needs: No Transportation Needs (08/12/2024)  Utilities: Not At Risk (08/12/2024)  Depression (PHQ2-9): Low Risk (11/25/2021)  Social Connections: Unknown (08/12/2024)  Tobacco Use: Low Risk (08/10/2024)    Readmission Risk Interventions     No data to display

## 2024-08-22 NOTE — Progress Notes (Incomplete)
" °                                                                                                                                                     °                                                   °  Palliative Medicine Progress Note   Patient Name: Zachary Heath       Date: 08/22/2024 DOB: 02-22-1946  Age: 79 y.o. MRN#: [id number] Attending Physician: Claudene Toribio BROCKS, MD Primary Care Physician: Delana Corean GRADE, MD Admit Date: 08/10/2024  Reason for Consultation/Follow-up: {Reason for Consult:23484}  HPI/Patient Profile: ***  Subjective: ***  Objective:  Physical Exam          Vital Signs: BP (!) 107/55 (BP Location: Right Arm)   Pulse 86   Temp 98.7 F (37.1 C) (Oral)   Resp (!) 42   Ht 5' 7 (1.702 m)   Wt 66.5 kg   SpO2 100%   BMI 22.96 kg/m  SpO2: SpO2: 100 % O2 Device: O2 Device: Heated High Flow Nasal Cannula O2 Flow Rate: O2 Flow Rate (L/min): 45 L/min  Intake/output summary:  Intake/Output Summary (Last 24 hours) at 08/22/2024 1347 Last data filed at 08/22/2024 1200 Gross per 24 hour  Intake 2015 ml  Output 3900 ml  Net -1885 ml    LBM: Last BM Date : 08/22/24     Palliative Assessment/Data: ***     Palliative Medicine Assessment & Plan   Assessment: Principal Problem:   Acute hypoxic respiratory failure (HCC) Active Problems:   Fall   Pneumonia of right middle lobe due to infectious organism   Sepsis (HCC)   Head injury   Influenza A   Aspiration pneumonia (HCC)   Parkinson's disease (HCC)   Malnutrition of moderate degree   Acute kidney injury superimposed on stage 3b chronic kidney disease (HCC)   Pneumonia of both lungs due to infectious organism   Traumatic rhabdomyolysis   Transaminitis    Recommendations/Plan: ***  Goals of Care and Additional Recommendations: Limitations on Scope of Treatment: {Recommended Scope and Preferences:21019}  Code Status:   Prognosis:  {Palliative Care Prognosis:23504}  Discharge  Planning: {Palliative dispostion:23505}  Care plan was discussed with ***  Thank you for allowing the Palliative Medicine Team to assist in the care of this patient.   ***   Recardo KATHEE Loll, NP   Please contact Palliative Medicine Team phone at [phone] for questions and concerns.  For individual providers, please see AMION.      "
